# Patient Record
Sex: Female | Born: 1935 | Race: Black or African American | Hispanic: No | Smoking: Never smoker
Health system: Southern US, Community
[De-identification: ages and names within clinical notes are randomized; demographics above are authoritative.]

## PROBLEM LIST (undated history)

## (undated) DIAGNOSIS — C801 Malignant (primary) neoplasm, unspecified: Secondary | ICD-10-CM

## (undated) DIAGNOSIS — C50919 Malignant neoplasm of unspecified site of unspecified female breast: Secondary | ICD-10-CM

## (undated) DIAGNOSIS — Z9221 Personal history of antineoplastic chemotherapy: Secondary | ICD-10-CM

## (undated) HISTORY — PX: BACK SURGERY: SHX140

## (undated) HISTORY — PX: ABDOMINAL HYSTERECTOMY: SHX81

## (undated) HISTORY — PX: TONSILLECTOMY AND ADENOIDECTOMY: SHX28

## (undated) HISTORY — PX: LUMBAR LAMINECTOMY: SHX95

## (undated) HISTORY — DX: Malignant neoplasm of unspecified site of unspecified female breast: C50.919

## (undated) HISTORY — PX: CATARACT EXTRACTION, BILATERAL: SHX1313

---

## 2002-09-09 ENCOUNTER — Encounter: Admission: RE | Admit: 2002-09-09 | Discharge: 2002-09-09 | Payer: Self-pay | Admitting: Emergency Medicine

## 2002-09-09 ENCOUNTER — Encounter: Payer: Self-pay | Admitting: Emergency Medicine

## 2003-10-18 ENCOUNTER — Encounter: Admission: RE | Admit: 2003-10-18 | Discharge: 2003-10-18 | Payer: Self-pay | Admitting: Emergency Medicine

## 2003-10-22 ENCOUNTER — Encounter: Admission: RE | Admit: 2003-10-22 | Discharge: 2003-10-22 | Payer: Self-pay | Admitting: Emergency Medicine

## 2003-10-27 ENCOUNTER — Encounter: Admission: RE | Admit: 2003-10-27 | Discharge: 2003-10-27 | Payer: Self-pay | Admitting: Emergency Medicine

## 2004-05-26 ENCOUNTER — Ambulatory Visit (HOSPITAL_COMMUNITY): Admission: RE | Admit: 2004-05-26 | Discharge: 2004-05-26 | Payer: Self-pay | Admitting: Orthopedic Surgery

## 2004-05-29 ENCOUNTER — Ambulatory Visit (HOSPITAL_BASED_OUTPATIENT_CLINIC_OR_DEPARTMENT_OTHER): Admission: RE | Admit: 2004-05-29 | Discharge: 2004-05-29 | Payer: Self-pay | Admitting: Orthopedic Surgery

## 2004-09-21 ENCOUNTER — Encounter: Admission: RE | Admit: 2004-09-21 | Discharge: 2004-09-21 | Payer: Self-pay | Admitting: Orthopedic Surgery

## 2004-10-03 ENCOUNTER — Encounter: Admission: RE | Admit: 2004-10-03 | Discharge: 2004-10-03 | Payer: Self-pay | Admitting: Orthopedic Surgery

## 2004-11-02 ENCOUNTER — Encounter: Admission: RE | Admit: 2004-11-02 | Discharge: 2004-11-02 | Payer: Self-pay | Admitting: Orthopedic Surgery

## 2009-06-25 DIAGNOSIS — C50919 Malignant neoplasm of unspecified site of unspecified female breast: Secondary | ICD-10-CM

## 2009-06-25 HISTORY — DX: Malignant neoplasm of unspecified site of unspecified female breast: C50.919

## 2009-12-28 ENCOUNTER — Encounter: Admission: RE | Admit: 2009-12-28 | Discharge: 2009-12-28 | Payer: Self-pay | Admitting: Internal Medicine

## 2009-12-29 ENCOUNTER — Ambulatory Visit: Payer: Self-pay | Admitting: Oncology

## 2010-01-01 ENCOUNTER — Encounter: Admission: RE | Admit: 2010-01-01 | Discharge: 2010-01-01 | Payer: Self-pay | Admitting: Internal Medicine

## 2010-01-04 LAB — COMPREHENSIVE METABOLIC PANEL
Albumin: 4.6 g/dL (ref 3.5–5.2)
CO2: 27 mEq/L (ref 19–32)
Calcium: 10.2 mg/dL (ref 8.4–10.5)
Chloride: 103 mEq/L (ref 96–112)
Glucose, Bld: 92 mg/dL (ref 70–99)
Potassium: 4.1 mEq/L (ref 3.5–5.3)
Sodium: 141 mEq/L (ref 135–145)
Total Bilirubin: 0.4 mg/dL (ref 0.3–1.2)
Total Protein: 7.8 g/dL (ref 6.0–8.3)

## 2010-01-04 LAB — CBC WITH DIFFERENTIAL/PLATELET
Basophils Absolute: 0 10*3/uL (ref 0.0–0.1)
Eosinophils Absolute: 0.1 10*3/uL (ref 0.0–0.5)
HGB: 12.2 g/dL (ref 11.6–15.9)
MCV: 89.1 fL (ref 79.5–101.0)
MONO#: 0.6 10*3/uL (ref 0.1–0.9)
MONO%: 8.5 % (ref 0.0–14.0)
NEUT#: 4.4 10*3/uL (ref 1.5–6.5)
RDW: 14.8 % — ABNORMAL HIGH (ref 11.2–14.5)
WBC: 6.6 10*3/uL (ref 3.9–10.3)

## 2010-01-11 ENCOUNTER — Ambulatory Visit (HOSPITAL_COMMUNITY): Admission: RE | Admit: 2010-01-11 | Discharge: 2010-01-11 | Payer: Self-pay | Admitting: Oncology

## 2010-01-23 ENCOUNTER — Ambulatory Visit (HOSPITAL_COMMUNITY): Admission: RE | Admit: 2010-01-23 | Discharge: 2010-01-23 | Payer: Self-pay | Admitting: Oncology

## 2010-03-02 ENCOUNTER — Ambulatory Visit: Payer: Self-pay | Admitting: Oncology

## 2010-03-06 ENCOUNTER — Ambulatory Visit: Admission: RE | Admit: 2010-03-06 | Discharge: 2010-03-17 | Payer: Self-pay | Admitting: Radiation Oncology

## 2010-03-07 LAB — CBC WITH DIFFERENTIAL/PLATELET
Basophils Absolute: 0 10*3/uL (ref 0.0–0.1)
Eosinophils Absolute: 0.1 10*3/uL (ref 0.0–0.5)
HGB: 12 g/dL (ref 11.6–15.9)
LYMPH%: 20.5 % (ref 14.0–49.7)
MCV: 88.6 fL (ref 79.5–101.0)
MONO%: 8.7 % (ref 0.0–14.0)
NEUT#: 4.2 10*3/uL (ref 1.5–6.5)
Platelets: 268 10*3/uL (ref 145–400)
RDW: 13.9 % (ref 11.2–14.5)

## 2010-03-07 LAB — T3: T3, Total: 117 ng/dL (ref 80.0–204.0)

## 2010-03-22 ENCOUNTER — Ambulatory Visit (HOSPITAL_COMMUNITY): Admission: RE | Admit: 2010-03-22 | Discharge: 2010-03-22 | Payer: Self-pay | Admitting: Oncology

## 2010-05-02 ENCOUNTER — Encounter: Admission: RE | Admit: 2010-05-02 | Discharge: 2010-05-02 | Payer: Self-pay | Admitting: Oncology

## 2010-05-04 ENCOUNTER — Ambulatory Visit: Payer: Self-pay | Admitting: Oncology

## 2010-05-08 LAB — CBC WITH DIFFERENTIAL/PLATELET
BASO%: 0.3 % (ref 0.0–2.0)
Eosinophils Absolute: 0.1 10*3/uL (ref 0.0–0.5)
MCHC: 33.9 g/dL (ref 31.5–36.0)
MONO#: 0.6 10*3/uL (ref 0.1–0.9)
NEUT#: 4.5 10*3/uL (ref 1.5–6.5)
RBC: 4.11 10*6/uL (ref 3.70–5.45)
RDW: 14.5 % (ref 11.2–14.5)
WBC: 6.7 10*3/uL (ref 3.9–10.3)

## 2010-06-07 ENCOUNTER — Ambulatory Visit (HOSPITAL_COMMUNITY)
Admission: RE | Admit: 2010-06-07 | Discharge: 2010-06-07 | Payer: Self-pay | Source: Home / Self Care | Attending: Oncology | Admitting: Oncology

## 2010-07-18 ENCOUNTER — Ambulatory Visit: Payer: Self-pay | Admitting: Oncology

## 2010-07-20 ENCOUNTER — Other Ambulatory Visit: Payer: Self-pay | Admitting: Oncology

## 2010-07-20 DIAGNOSIS — C50919 Malignant neoplasm of unspecified site of unspecified female breast: Secondary | ICD-10-CM

## 2010-07-20 LAB — CBC WITH DIFFERENTIAL/PLATELET
Basophils Absolute: 0 10*3/uL (ref 0.0–0.1)
Eosinophils Absolute: 0.1 10*3/uL (ref 0.0–0.5)
HGB: 12.5 g/dL (ref 11.6–15.9)
NEUT#: 4.1 10*3/uL (ref 1.5–6.5)
RDW: 13.9 % (ref 11.2–14.5)
lymph#: 1.4 10*3/uL (ref 0.9–3.3)

## 2010-07-25 ENCOUNTER — Other Ambulatory Visit: Payer: Self-pay | Admitting: Oncology

## 2010-07-25 DIAGNOSIS — C50912 Malignant neoplasm of unspecified site of left female breast: Secondary | ICD-10-CM

## 2010-08-03 ENCOUNTER — Ambulatory Visit
Admission: RE | Admit: 2010-08-03 | Discharge: 2010-08-03 | Disposition: A | Discharge status: Home / Self Care | Payer: MEDICARE | Source: Ambulatory Visit | Attending: Oncology | Admitting: Oncology

## 2010-08-03 DIAGNOSIS — C50912 Malignant neoplasm of unspecified site of left female breast: Secondary | ICD-10-CM

## 2010-08-03 MED ORDER — GADOBENATE DIMEGLUMINE 529 MG/ML IV SOLN
18.0000 mL | Freq: Once | INTRAVENOUS | Status: AC | PRN
  Administered 2010-08-03: 18 mL via INTRAVENOUS

## 2010-11-10 NOTE — Op Note (Signed)
NAMELAYCI, STENGLEIN             ACCOUNT NO.:  [id number]   MEDICAL RECORD NO.:  [id number]          PATIENT TYPE:  AMB   LOCATION:  DSC                          FACILITY:  [facility]   PHYSICIAN:  Robert A. Thurston Hole, M.D. DATE OF BIRTH:  10/13/1933   DATE OF PROCEDURE:  05/29/2004  DATE OF DISCHARGE:                                 OPERATIVE REPORT   PREOPERATIVE DIAGNOSIS:  Right knee medial and lateral meniscal tears with  chondromalacia and synovitis.   POSTOPERATIVE DIAGNOSIS:  Right knee medial and lateral meniscal tears with  chondromalacia and synovitis.   OPERATION PERFORMED:  1.  Right knee examination under anesthesia followed by arthroscopic partial      medial and lateral meniscectomies.  2.  Right knee chondroplasties with partial synovectomy.   SURGEON:  Elana Alm. Thurston Hole, M.D.   ANESTHESIA:  General.   OPERATIVE TIME:  30 minutes.   COMPLICATIONS:  None.   INDICATIONS FOR PROCEDURE:  Ms. Eifler is a 77 year old woman who has had  eight to 10 months of increasing right knee pain with exam and MRI  documenting a medial meniscus tear with chondromalacia and synovitis who has  failed conservative care and is now to undergo arthroscopy.   DESCRIPTION OF PROCEDURE:  Ms. Berggren was brought to the operating room on  May 29, 2004 and placed on the operating table in supine position. After  an adequate level of general anesthesia was obtained, her right knee was  examined.  Range of motion 0 to 125 degrees, 1 to 2+ crepitation.  Knee  stable to ligamentous exam with normal patellar tracking.  Knee was  sterilely injected with 0.25% Marcaine with epinephrine.  Right leg was  prepped using sterile DuraPrep and draped using sterile technique.  Originally through an anterior and lateral portal the arthroscope with a  pump attached was placed and through an anteromedial portal, an arthroscopic  probe was placed.  On initial inspection of the medial compartment, she was  found to have 75% grade 3 chondromalacia which was debrided.  Medial  meniscal tear posterior and medial horn of which 60 to 70% was resected back  to a stable rim.  ACL and PCL was normal.  Lateral compartment showed 30%  grade 3 chondromalacia which was debrided.  Lateral meniscus showed a small  tear posterolateral corner 25% which was resected back to a stable rim.  Patellofemoral joint showed 30 to 40% grade 3 chondromalacia on the  patellofemoral groove and this was debrided.  Patella tracked normally.  Moderate synovitis in the medial  lateral gutters was debrided.  Otherwise  they were free of pathology.  After this was done, it was felt that all  pathology had been satisfactorily addressed.  The instruments were removed.  Portals were closed with 3-0 nylon suture and injected with 0.25% Marcaine  with epinephrine and 4 mg of morphine.  Sterile dressing was applied and the  patient awakened and taken to the recovery room in stable condition.   FOLLOW UP:  Ms. Stroschein will be followed as an outpatient on Vicodin and  Naprosyn.  See her back  in the office in a week for suture removal and  follow-up.       RAW/MEDQ  D:  05/29/2004  T:  05/29/2004  Job:  [id number]

## 2010-11-27 ENCOUNTER — Other Ambulatory Visit: Payer: Self-pay | Admitting: Oncology

## 2010-11-27 ENCOUNTER — Encounter (HOSPITAL_BASED_OUTPATIENT_CLINIC_OR_DEPARTMENT_OTHER): Payer: Medicare Other | Admitting: Oncology

## 2010-11-27 DIAGNOSIS — C50919 Malignant neoplasm of unspecified site of unspecified female breast: Secondary | ICD-10-CM

## 2010-11-27 DIAGNOSIS — Z170 Estrogen receptor positive status [ER+]: Secondary | ICD-10-CM

## 2010-11-27 LAB — CBC WITH DIFFERENTIAL/PLATELET
BASO%: 0.3 % (ref 0.0–2.0)
Eosinophils Absolute: 0.1 10*3/uL (ref 0.0–0.5)
HCT: 34.6 % — ABNORMAL LOW (ref 34.8–46.6)
LYMPH%: 19 % (ref 14.0–49.7)
MONO#: 0.6 10*3/uL (ref 0.1–0.9)
NEUT#: 4.3 10*3/uL (ref 1.5–6.5)
NEUT%: 70.2 % (ref 38.4–76.8)
Platelets: 262 10*3/uL (ref 145–400)
RBC: 3.83 10*6/uL (ref 3.70–5.45)
WBC: 6.1 10*3/uL (ref 3.9–10.3)
lymph#: 1.2 10*3/uL (ref 0.9–3.3)

## 2010-11-27 LAB — COMPREHENSIVE METABOLIC PANEL
ALT: 17 U/L (ref 0–35)
CO2: 29 mEq/L (ref 19–32)
Calcium: 10.1 mg/dL (ref 8.4–10.5)
Chloride: 100 mEq/L (ref 96–112)
Glucose, Bld: 65 mg/dL — ABNORMAL LOW (ref 70–99)
Sodium: 139 mEq/L (ref 135–145)
Total Bilirubin: 0.3 mg/dL (ref 0.3–1.2)
Total Protein: 7.3 g/dL (ref 6.0–8.3)

## 2010-11-27 LAB — VITAMIN D 25 HYDROXY (VIT D DEFICIENCY, FRACTURES): Vit D, 25-Hydroxy: 40 ng/mL (ref 30–89)

## 2011-03-01 ENCOUNTER — Other Ambulatory Visit: Payer: Self-pay | Admitting: Oncology

## 2011-03-01 DIAGNOSIS — C50919 Malignant neoplasm of unspecified site of unspecified female breast: Secondary | ICD-10-CM

## 2011-03-14 ENCOUNTER — Ambulatory Visit (HOSPITAL_COMMUNITY)
Admission: RE | Admit: 2011-03-14 | Discharge: 2011-03-14 | Disposition: A | Discharge status: Home / Self Care | Payer: Medicare Other | Source: Ambulatory Visit | Attending: Oncology | Admitting: Oncology

## 2011-03-14 DIAGNOSIS — C50919 Malignant neoplasm of unspecified site of unspecified female breast: Secondary | ICD-10-CM | POA: Insufficient documentation

## 2011-03-14 LAB — CREATININE, SERUM: Creatinine, Ser: 0.83 mg/dL (ref 0.50–1.10)

## 2011-03-14 MED ORDER — GADOBENATE DIMEGLUMINE 529 MG/ML IV SOLN
15.0000 mL | Freq: Once | INTRAVENOUS | Status: AC | PRN
  Administered 2011-03-14: 15 mL via INTRAVENOUS

## 2011-03-29 ENCOUNTER — Encounter (HOSPITAL_BASED_OUTPATIENT_CLINIC_OR_DEPARTMENT_OTHER): Payer: Medicare Other | Admitting: Oncology

## 2011-03-29 ENCOUNTER — Other Ambulatory Visit: Payer: Self-pay | Admitting: Oncology

## 2011-03-29 DIAGNOSIS — Z170 Estrogen receptor positive status [ER+]: Secondary | ICD-10-CM

## 2011-03-29 DIAGNOSIS — C50912 Malignant neoplasm of unspecified site of left female breast: Secondary | ICD-10-CM

## 2011-03-29 DIAGNOSIS — C50919 Malignant neoplasm of unspecified site of unspecified female breast: Secondary | ICD-10-CM

## 2011-03-29 LAB — CBC WITH DIFFERENTIAL/PLATELET
Eosinophils Absolute: 0.1 10*3/uL (ref 0.0–0.5)
HCT: 35.1 % (ref 34.8–46.6)
LYMPH%: 21.2 % (ref 14.0–49.7)
MCV: 88.7 fL (ref 79.5–101.0)
MONO#: 0.5 10*3/uL (ref 0.1–0.9)
MONO%: 9.2 % (ref 0.0–14.0)
NEUT#: 3.8 10*3/uL (ref 1.5–6.5)
NEUT%: 68.6 % (ref 38.4–76.8)
Platelets: 235 10*3/uL (ref 145–400)
RBC: 3.96 10*6/uL (ref 3.70–5.45)

## 2011-03-30 LAB — COMPREHENSIVE METABOLIC PANEL
ALT: 17 U/L (ref 0–35)
AST: 25 U/L (ref 0–37)
CO2: 26 mEq/L (ref 19–32)
Chloride: 101 mEq/L (ref 96–112)
Creatinine, Ser: 0.84 mg/dL (ref 0.50–1.10)
Sodium: 139 mEq/L (ref 135–145)
Total Bilirubin: 0.4 mg/dL (ref 0.3–1.2)
Total Protein: 7.3 g/dL (ref 6.0–8.3)

## 2011-04-25 ENCOUNTER — Encounter (HOSPITAL_BASED_OUTPATIENT_CLINIC_OR_DEPARTMENT_OTHER): Payer: Medicare Other | Admitting: Oncology

## 2011-04-25 DIAGNOSIS — Z170 Estrogen receptor positive status [ER+]: Secondary | ICD-10-CM

## 2011-04-25 DIAGNOSIS — C50919 Malignant neoplasm of unspecified site of unspecified female breast: Secondary | ICD-10-CM

## 2011-04-25 DIAGNOSIS — Z79810 Long term (current) use of selective estrogen receptor modulators (SERMs): Secondary | ICD-10-CM

## 2011-05-08 ENCOUNTER — Telehealth: Payer: Self-pay | Admitting: *Deleted

## 2011-05-08 NOTE — Telephone Encounter (Signed)
PT. IS AT HER PRIMARY CARE PHYSICIAN'S OFFICE. MAY SHE HAVE A FLU SHOT? INFORMED PT. IT IS OK TO GET A FLU SHOT.

## 2011-07-03 ENCOUNTER — Other Ambulatory Visit: Payer: Self-pay

## 2011-07-03 ENCOUNTER — Encounter: Payer: Self-pay | Admitting: Emergency Medicine

## 2011-07-03 ENCOUNTER — Other Ambulatory Visit: Payer: Self-pay | Admitting: Oncology

## 2011-07-03 ENCOUNTER — Other Ambulatory Visit (HOSPITAL_BASED_OUTPATIENT_CLINIC_OR_DEPARTMENT_OTHER): Payer: Medicare Other | Admitting: Lab

## 2011-07-03 ENCOUNTER — Emergency Department (HOSPITAL_COMMUNITY): Payer: Medicare Other

## 2011-07-03 ENCOUNTER — Emergency Department (HOSPITAL_COMMUNITY)
Admission: EM | Admit: 2011-07-03 | Discharge: 2011-07-03 | Disposition: A | Discharge status: Home / Self Care | Payer: Medicare Other | Attending: Emergency Medicine | Admitting: Emergency Medicine

## 2011-07-03 DIAGNOSIS — R29810 Facial weakness: Secondary | ICD-10-CM | POA: Insufficient documentation

## 2011-07-03 DIAGNOSIS — C50919 Malignant neoplasm of unspecified site of unspecified female breast: Secondary | ICD-10-CM

## 2011-07-03 DIAGNOSIS — G510 Bell's palsy: Secondary | ICD-10-CM

## 2011-07-03 DIAGNOSIS — Z79810 Long term (current) use of selective estrogen receptor modulators (SERMs): Secondary | ICD-10-CM | POA: Insufficient documentation

## 2011-07-03 DIAGNOSIS — Z853 Personal history of malignant neoplasm of breast: Secondary | ICD-10-CM | POA: Insufficient documentation

## 2011-07-03 HISTORY — DX: Malignant (primary) neoplasm, unspecified: C80.1

## 2011-07-03 LAB — CBC WITH DIFFERENTIAL/PLATELET
BASO%: 0.3 % (ref 0.0–2.0)
Basophils Absolute: 0 10*3/uL (ref 0.0–0.1)
EOS%: 1 % (ref 0.0–7.0)
HCT: 36 % (ref 34.8–46.6)
HGB: 12.1 g/dL (ref 11.6–15.9)
LYMPH%: 22.4 % (ref 14.0–49.7)
MCH: 30.3 pg (ref 25.1–34.0)
MCHC: 33.5 g/dL (ref 31.5–36.0)
MCV: 90.4 fL (ref 79.5–101.0)
MONO%: 9.6 % (ref 0.0–14.0)
NEUT%: 66.7 % (ref 38.4–76.8)
Platelets: 244 10*3/uL (ref 145–400)

## 2011-07-03 LAB — POCT I-STAT, CHEM 8
BUN: 19 mg/dL (ref 6–23)
Creatinine, Ser: 1.1 mg/dL (ref 0.50–1.10)
Glucose, Bld: 94 mg/dL (ref 70–99)
Hemoglobin: 12.6 g/dL (ref 12.0–15.0)
Potassium: 3.6 mEq/L (ref 3.5–5.1)
Sodium: 139 mEq/L (ref 135–145)

## 2011-07-03 LAB — BASIC METABOLIC PANEL
BUN: 19 mg/dL (ref 6–23)
CO2: 28 mEq/L (ref 19–32)
Calcium: 9.5 mg/dL (ref 8.4–10.5)
Chloride: 99 mEq/L (ref 96–112)
Creatinine, Ser: 0.89 mg/dL (ref 0.50–1.10)
GFR calc Af Amer: 72 mL/min — ABNORMAL LOW (ref >90)
GFR calc non Af Amer: 62 mL/min — ABNORMAL LOW (ref >90)
Glucose, Bld: 93 mg/dL (ref 70–99)
Potassium: 3.7 mEq/L (ref 3.5–5.1)
Sodium: 138 mEq/L (ref 135–145)

## 2011-07-03 LAB — CANCER ANTIGEN 27.29: CA 27.29: 14 U/mL (ref 0–39)

## 2011-07-03 LAB — CBC
HCT: 32.7 % — ABNORMAL LOW (ref 36.0–46.0)
MCH: 29.1 pg (ref 26.0–34.0)
MCV: 87.4 fL (ref 78.0–100.0)
Platelets: 234 10*3/uL (ref 150–400)
RDW: 13.7 % (ref 11.5–15.5)
WBC: 5.4 10*3/uL (ref 4.0–10.5)

## 2011-07-03 LAB — COMPREHENSIVE METABOLIC PANEL
Albumin: 4.4 g/dL (ref 3.5–5.2)
BUN: 20 mg/dL (ref 6–23)
Calcium: 9.4 mg/dL (ref 8.4–10.5)
Chloride: 99 mEq/L (ref 96–112)
Creatinine, Ser: 1.01 mg/dL (ref 0.50–1.10)
Glucose, Bld: 87 mg/dL (ref 70–99)
Potassium: 3.6 mEq/L (ref 3.5–5.3)

## 2011-07-03 MED ORDER — PREDNISONE 5 MG PO TABS: 5.0000 mg | ORAL_TABLET | Freq: Every day | ORAL | Status: AC

## 2011-07-03 MED ORDER — PREDNISONE 50 MG PO TABS: ORAL_TABLET | ORAL | Status: AC

## 2011-07-03 MED ORDER — VALACYCLOVIR HCL 1 G PO TABS: 1000.0000 mg | ORAL_TABLET | Freq: Two times a day (BID) | ORAL | Status: DC

## 2011-07-03 MED ORDER — VALACYCLOVIR HCL 500 MG PO TABS
1000.0000 mg | ORAL_TABLET | Freq: Once | ORAL | Status: AC
  Administered 2011-07-03: 1000 mg via ORAL
  Filled 2011-07-03 (×2): qty 2

## 2011-07-03 MED ORDER — PREDNISONE 50 MG PO TABS
50.0000 mg | ORAL_TABLET | Freq: Once | ORAL | Status: AC
  Administered 2011-07-03: 50 mg via ORAL
  Filled 2011-07-03: qty 1

## 2011-07-03 NOTE — ED Provider Notes (Signed)
History     CSN: [id number]  Arrival date & time 07/03/11  1043   First MD Initiated Contact with Patient 07/03/11 1128      Chief Complaint  Patient presents with  . Facial Swelling  . Facial Droop    (Consider location/radiation/quality/duration/timing/severity/associated sxs/prior treatment) HPI The patient presents 6 hours after awakening with left sided facial droop. She notes that she has been in her usual state of health, taking daily tamoxifen for breast cancer, and denies any recent changes in medications, activities, or anything notable. Today soon after awakening she noticed that the left eye her face was drooping and that she was having a difficult time drinking liquids. She denies any pain. She denies any alleviating or exacerbating factors. She presented to her oncologist's office and was referred to the emergency department for further evaluation. Past Medical History  Diagnosis Date  . Cancer     History reviewed. No pertinent past surgical history.  History reviewed. No pertinent family history.  History  Substance Use Topics  . Smoking status: Not on file  . Smokeless tobacco: Not on file  . Alcohol Use:     OB History    Grav Para Term Preterm Abortions TAB SAB Ect Mult Living                  Review of Systems  Constitutional:       HPI  HENT:       HPI otherwise negative  Eyes: Negative.   Respiratory:       HPI, otherwise negative  Cardiovascular:       HPI, otherwise nmegative  Gastrointestinal: Negative for vomiting.  Genitourinary:       HPI, otherwise negative  Musculoskeletal:       HPI, otherwise negative  Skin: Negative.   Neurological: Negative for syncope.    Allergies  Sulfa antibiotics  Home Medications   Current Outpatient Rx  Name Route Sig Dispense Refill  . BIOTIN FORTE PO Oral Take 1 tablet by mouth daily.      Marland Kitchen CALCIUM CARBONATE-VITAMIN D 600-400 MG-UNIT PO TABS Oral Take 1 tablet by mouth daily.      Marland Kitchen  FLUTICASONE PROPIONATE 50 MCG/ACT NA SUSP Nasal Place 2 sprays into the nose daily. allergies    . FUROSEMIDE 40 MG PO TABS Oral Take 40 mg by mouth daily.      Marland Kitchen LISINOPRIL-HYDROCHLOROTHIAZIDE 20-25 MG PO TABS Oral Take 1 tablet by mouth daily.      Marland Kitchen LOVASTATIN 40 MG PO TABS Oral Take 40 mg by mouth at bedtime.      . CENTRUM SILVER PO Oral Take 1 tablet by mouth daily.      Marland Kitchen POTASSIUM CHLORIDE CRYS ER 20 MEQ PO TBCR Oral Take 20 mEq by mouth daily.      Marland Kitchen TAMOXIFEN CITRATE 20 MG PO TABS Oral Take 20 mg by mouth daily.        Pulse 78  Temp(Src) 97.8 F (36.6 C) (Oral)  Resp 16  SpO2 98%  Physical Exam  Nursing note and vitals reviewed. Constitutional: She is oriented to person, place, and time. She appears well-developed and well-nourished. No distress.  HENT:  Head: Normocephalic and atraumatic.  Eyes: Conjunctivae and EOM are normal.  Cardiovascular: Normal rate and regular rhythm.   Pulmonary/Chest: Effort normal and breath sounds normal. No stridor. No respiratory distress.  Abdominal: She exhibits no distension.  Musculoskeletal: She exhibits no edema.  Neurological: She is alert and  oriented to person, place, and time. She has normal strength. She displays no atrophy and no tremor. A cranial nerve deficit is present. No sensory deficit. She exhibits normal muscle tone. She displays no seizure activity. Coordination and gait normal.       Left facial droop, most pronounced with smiling, no appreciable asymmetry with brow raising  Skin: Skin is warm and dry.  Psychiatric: She has a normal mood and affect.    ED Course  Procedures (including critical care time)   Labs Reviewed  I-STAT, CHEM 8  BASIC METABOLIC PANEL  CBC  PROTIME-INR   No results found.   No diagnosis found.    MDM  Early well-appearing 78 year old female presents following the onset of left facial droop. The patient's absence of other asymmetry, any other weakness, the characteristics of the  deficiency are all reassuring for Bell's palsy as most likely etiology. Given the patient's history of breast cancer, she had a CT brain which was unremarkable. The remainder of the patient's laboratory evaluation was similarly unremarkable.  Given this early presentation of the patient will be started on steroids and valacyclovir.  The possibility of this being a manifestation of a small stroke was discussed with the patient, as the utility of starting therapy today for this presumed etiology, having MRI tomorrow concurrently with her scheduled thoracic MR, then reassessing to ensure that this is not indeed a manifestation of a smaller infarct or metastases .     Gerhard Munch, MD 07/03/11 1430

## 2011-07-03 NOTE — ED Notes (Signed)
MD at bedside assessing pt.

## 2011-07-03 NOTE — ED Notes (Signed)
MD at bedside. 

## 2011-07-03 NOTE — ED Notes (Signed)
Pt states woke up at 0600 and R side of face swollen and R side bottom lip drooping and unable to control lip. Pt states was unable to hold liquid in mouth this am, pt talking without difficulties at this time.  Pt denies pain or weakness. Pt noted to have equal strong grips no further neuro deficits at this time. Pt has breast CA and is on treatment for. Pt has MRI tomorrow.

## 2011-07-03 NOTE — ED Notes (Signed)
Pt waiting for meds from Pharmacy, then will be d/c to home.

## 2011-07-03 NOTE — ED Notes (Signed)
Returned from CT scan, waiting for results

## 2011-07-03 NOTE — ED Notes (Signed)
Pt denies trauma, pt denies pain and weakness at this time. Pt denies any further s/s, was at [facility] getting lab work and was told to come to ER

## 2011-07-04 ENCOUNTER — Other Ambulatory Visit (HOSPITAL_COMMUNITY): Payer: Medicare Other

## 2011-07-04 ENCOUNTER — Inpatient Hospital Stay: Admission: RE | Admit: 2011-07-04 | Payer: Medicare Other | Source: Ambulatory Visit

## 2011-07-10 ENCOUNTER — Ambulatory Visit: Payer: Medicare Other | Admitting: Oncology

## 2011-07-13 ENCOUNTER — Encounter: Payer: Self-pay | Admitting: Oncology

## 2011-07-13 NOTE — Progress Notes (Signed)
07/13/2011  Received via fax an approval for [facility].  I have spoken with the patient to give her this information.  I have also emailed Sullivan Cox @ [facility] so this appt. Can be scheduled.  [phone]   01/16/20130-003/07/2011

## 2011-07-31 ENCOUNTER — Telehealth: Payer: Self-pay | Admitting: *Deleted

## 2011-07-31 NOTE — Telephone Encounter (Signed)
Notified pt of future appointments.  

## 2011-08-01 ENCOUNTER — Ambulatory Visit
Admission: RE | Admit: 2011-08-01 | Discharge: 2011-08-01 | Disposition: A | Discharge status: Home / Self Care | Payer: Medicare Other | Source: Ambulatory Visit | Attending: Oncology | Admitting: Oncology

## 2011-08-01 DIAGNOSIS — C50912 Malignant neoplasm of unspecified site of left female breast: Secondary | ICD-10-CM

## 2011-08-01 MED ORDER — GADOBENATE DIMEGLUMINE 529 MG/ML IV SOLN
17.0000 mL | Freq: Once | INTRAVENOUS | Status: AC | PRN
  Administered 2011-08-01: 17 mL via INTRAVENOUS

## 2011-08-06 ENCOUNTER — Ambulatory Visit (HOSPITAL_BASED_OUTPATIENT_CLINIC_OR_DEPARTMENT_OTHER): Payer: Medicare Other | Admitting: Oncology

## 2011-08-06 ENCOUNTER — Telehealth: Payer: Self-pay | Admitting: Oncology

## 2011-08-06 VITALS — BP 158/69 | HR 79 | Temp 98.1°F | Wt 179.1 lb

## 2011-08-06 DIAGNOSIS — C50919 Malignant neoplasm of unspecified site of unspecified female breast: Secondary | ICD-10-CM

## 2011-08-06 DIAGNOSIS — C50912 Malignant neoplasm of unspecified site of left female breast: Secondary | ICD-10-CM | POA: Insufficient documentation

## 2011-08-06 NOTE — Progress Notes (Signed)
ID: Rocky Link  DOB: August 08, 1933  MR#: [id number]  CSN#: [id number]   Interval History:   The patient returns today with her sister, [name], for followup of Lesleyanne's breast cancer. She really is tolerating the tamoxifen without unusual side effects. She has fewer hot flashes than with the letrozole. She is having some vaginal wetness, and she uses a pad to take care of that issue.  ROS:  When she came here in January for lab work she was found by the attack to have some right facial droopiness. She was walked across to the emergency room where she was evaluated and diagnosed with a right Bell's palsy. She was treated with Valtrex and prednisone for 10 days. The symptoms have just about completely resolved. Otherwise a detailed review of systems is stable. She is not exercising regularly at present are promised to get back to the treadmill at the gym within the week.  Allergies  Allergen Reactions  . Sulfa Antibiotics Rash    Current Outpatient Prescriptions  Medication Sig Dispense Refill  . Calcium Carbonate-Vitamin D (CALTRATE 600+D) 600-400 MG-UNIT per tablet Take 1 tablet by mouth daily.        . fluticasone (FLONASE) 50 MCG/ACT nasal spray Place 2 sprays into the nose daily. allergies      . furosemide (LASIX) 40 MG tablet Take 40 mg by mouth daily.        Marland Kitchen lisinopril-hydrochlorothiazide (PRINZIDE,ZESTORETIC) 20-25 MG per tablet Take 1 tablet by mouth daily.        Marland Kitchen lovastatin (MEVACOR) 40 MG tablet Take 40 mg by mouth at bedtime.        . Multiple Vitamins-Minerals (CENTRUM SILVER PO) Take 1 tablet by mouth daily.        . potassium chloride SA (K-DUR,KLOR-CON) 20 MEQ tablet Take 20 mEq by mouth daily.        . tamoxifen (NOLVADEX) 20 MG tablet Take 20 mg by mouth daily.        Marland Kitchen BIOTIN FORTE PO Take 1 tablet by mouth daily.        . valACYclovir (VALTREX) 1000 MG tablet Take 1 tablet (1,000 mg total) by mouth 2 (two) times daily.  20 tablet  0   PAST MEDICAL HISTORY:  Right knee  arthroscopy remotely under Dr. Thurston Hole, history of hypertension, history of seasonal allergies, status post TAH/BSO with a prior history of fibroids, status post tonsillectomy and adenoidectomy, status post lumbar laminectomy under Autumn Messing, status post bilateral cataract surgery, and history of hypercholesterolemia, history of right Bell's palsy as noted above  FAMILY HISTORY:  The patient's father died from a myocardial infarction at age 77.  The patient's mother died with heart problems at age 46.  The patient is one of 12 siblings, 3 sisters and 9 brothers.  There is no history of breast or ovarian cancer in the immediate family, although 2 of the patient's mother's sisters out of 4 had breast cancer late in life.  GYNECOLOGIC HISTORY:  The patient is GX, P0.  She took hormone replacement after her hysterectomy until about 2005.  SOCIAL HISTORY:  She used to live in [place], and worked for [employer].  When she retired in 2001, she moved back to [place].  She is a widow, and lives in [facility], which is a home for senior citizens.  She has an independent apartment, but if she needed nursing home services, it would be available through the same facility.  Her sister, Delois "[name]" Bergman,  is present today.  She also lives in [place].  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  In case of any problems, Kinlie would like [place] to contact Delois, and Delois's home number is [phone], and her cell number is [phone].  Objective:  Filed Vitals:   08/06/11 1505  BP: 158/69  Pulse: 79  Temp: 98.1 F (36.7 C)    BMI: There is no height on file to calculate BMI.   ECOG FS: 1  Physical Exam:   Sclerae unicteric  Oropharynx clear  There is a palpable lymph node in the left axilla measuring approximately 2 cm. This is unchanged from baseline. There is no other peripheral adenopathy.  Lungs clear -- no rales or rhonchi  Heart regular rate and  rhythm  Abdomen benign  MSK no focal spinal tenderness, no peripheral edema  Neuro nonfocal  Breast exam: Right breast is unremarkable. The left breast shows no palpable mass. There are no skin changes. The nipple is not retracted.  Lab Results:      Chemistry      Component Value Date/Time   NA 139 07/03/2011 1219   K 3.6 07/03/2011 1219   CL 99 07/03/2011 1219   CO2 28 07/03/2011 1210   BUN 19 07/03/2011 1219   CREATININE 1.10 07/03/2011 1219      Component Value Date/Time   CALCIUM 9.5 07/03/2011 1210   ALKPHOS 44 07/03/2011 1029   ALKPHOS 44 07/03/2011 1029   ALKPHOS 44 07/03/2011 1029   AST 29 07/03/2011 1029   AST 29 07/03/2011 1029   AST 29 07/03/2011 1029   ALT 23 07/03/2011 1029   ALT 23 07/03/2011 1029   ALT 23 07/03/2011 1029   BILITOT 0.5 07/03/2011 1029   BILITOT 0.5 07/03/2011 1029   BILITOT 0.5 07/03/2011 1029       Lab Results  Component Value Date   WBC 5.4 07/03/2011   HGB 12.6 07/03/2011   HCT 37.0 07/03/2011   MCV 87.4 07/03/2011   PLT 234 07/03/2011   NEUTROABS 4.1 07/03/2011    Studies/Results:  Mr Breast Bilateral W Wo Contrast  08/01/2011  *RADIOLOGY REPORT*  Clinical Data: Reevaluate response following neoadjuvant therapy.  BILATERAL BREAST MRI WITH AND WITHOUT CONTRAST  Technique: Multiplanar, multisequence MR images of both breasts were obtained prior to and following the intravenous administration of 17ml of Multihance.  Three dimensional images were evaluated at the independent DynaCad workstation.  Comparison:  03/14/2011 and 08/03/2010 .  Findings: There is mild generalized background enhancement.  In the area of previously seen mass located within the left breast at the 12 o'clock position there is no residual enhancement.  On the precontrast images, as noted previously, there is nonenhancing mass effect present which has decreased in size and now measures 2.0 x 1.5 x 1.5 cm in size.  There are no new enhancing lesions.  There are no suspicious axillary or internal mammary lymph nodes  and there are no additional findings.  IMPRESSION: No residual enhancement associated with the left breast mass located 12 o'clock position.  Nonenhancing mass effect is present but has decreased in size.  THREE-DIMENSIONAL MR IMAGE RENDERING ON INDEPENDENT WORKSTATION:  Three-dimensional MR images were rendered by post-processing of the original MR data on an independent workstation.  The three- dimensional MR images were interpreted, and findings were reported in the accompanying complete MRI report for this study.  BI-RADS CATEGORY 6:  Known biopsy-proven malignancy - appropriate action should be taken.  Original Report Authenticated By: Rolla Plate, M.D.    Assessment: 78 year old [place] woman status post left breast biopsy in July 2011 for clinical T2 N0, Stage IIA  invasive ductal carcinoma, grade 1, strongly estrogen and receptor positive, both at 100%, HER2/neu negative, with low MIB-1 of 5%. A left axillary lymph node biopsied at the time of initial diagnosis was negative.   (1) Declined surgery.    (2) On neoadjuvant letrozole between July 2011 and October 2012.  MRI at that time showed some evidence of tumor growth, letrozole was discontinued  (3)  started on tamoxifen in early October 2012.     Plan: I gave her the results of her MRI which do show improvement both in the density of the lesion and its size. We spent the better part of her 45 minute appointment today though I discussing the issue of surgery. I did my best to explain to her that we cannot cure her breast cancer with tamoxifen alone, and that the cancer will learn to grow through this pill this as it did through the last pill. That means we would have to change for potentially more aggressive treatment.   She remains very fearful of surgery, "because of my age". We went over the fact that the surgeon could do her lumpectomy under local anesthesia if necessary. She tells me she is going to think about it some more and  discuss it again when she returns to see me in May. We will do lab work that same day but she will have a mammogram at [facility] a few days before the visit. I gave her my card with my fax number so they can get [place] that result in time.     MAGRINAT,GUSTAV C 08/06/2011

## 2011-08-06 NOTE — Telephone Encounter (Signed)
gve the pt her may 2013 appt calendar 

## 2011-09-20 ENCOUNTER — Other Ambulatory Visit: Payer: Self-pay | Admitting: *Deleted

## 2011-09-20 DIAGNOSIS — C50919 Malignant neoplasm of unspecified site of unspecified female breast: Secondary | ICD-10-CM

## 2011-09-20 MED ORDER — TAMOXIFEN CITRATE 20 MG PO TABS: 20.0000 mg | ORAL_TABLET | Freq: Every day | ORAL | Status: DC

## 2011-10-02 ENCOUNTER — Other Ambulatory Visit: Payer: Medicare Other | Admitting: Lab

## 2011-10-02 ENCOUNTER — Ambulatory Visit: Payer: Medicare Other | Admitting: Oncology

## 2011-10-04 ENCOUNTER — Encounter: Payer: Self-pay | Admitting: Oncology

## 2011-10-04 ENCOUNTER — Telehealth: Payer: Self-pay | Admitting: *Deleted

## 2011-10-04 NOTE — Progress Notes (Signed)
10/04/2011  Received a phone call from this patient:  RE: Scheduling of her mammogram @ [facility] before her May 9th visit with Dr. Darnelle Catalan.  I informed mrs. Sterling that i would personally take the number to Crystal, his scheduler for this appt. To be scheduled and she would receive a phone call with the date and time.\\Linda Burnell Blanks

## 2011-10-04 NOTE — Telephone Encounter (Signed)
gave patient appointment for 10-2011 printed out calendar and gave to the patient 

## 2011-11-01 ENCOUNTER — Other Ambulatory Visit (HOSPITAL_BASED_OUTPATIENT_CLINIC_OR_DEPARTMENT_OTHER): Payer: Medicare Other | Admitting: Lab

## 2011-11-01 ENCOUNTER — Telehealth: Payer: Self-pay | Admitting: *Deleted

## 2011-11-01 ENCOUNTER — Ambulatory Visit (HOSPITAL_BASED_OUTPATIENT_CLINIC_OR_DEPARTMENT_OTHER): Payer: Medicare Other | Admitting: Oncology

## 2011-11-01 VITALS — BP 129/67 | HR 74 | Temp 98.1°F | Wt 177.9 lb

## 2011-11-01 DIAGNOSIS — C50919 Malignant neoplasm of unspecified site of unspecified female breast: Secondary | ICD-10-CM

## 2011-11-01 LAB — COMPREHENSIVE METABOLIC PANEL
ALT: 22 U/L (ref 0–35)
Albumin: 4.5 g/dL (ref 3.5–5.2)
CO2: 26 mEq/L (ref 19–32)
Chloride: 103 mEq/L (ref 96–112)
Glucose, Bld: 93 mg/dL (ref 70–99)
Potassium: 3.6 mEq/L (ref 3.5–5.3)
Sodium: 138 mEq/L (ref 135–145)
Total Bilirubin: 0.4 mg/dL (ref 0.3–1.2)
Total Protein: 7 g/dL (ref 6.0–8.3)

## 2011-11-01 LAB — CBC WITH DIFFERENTIAL/PLATELET
Basophils Absolute: 0 10*3/uL (ref 0.0–0.1)
Eosinophils Absolute: 0.1 10*3/uL (ref 0.0–0.5)
HGB: 11.9 g/dL (ref 11.6–15.9)
MONO#: 0.4 10*3/uL (ref 0.1–0.9)
MONO%: 7.4 % (ref 0.0–14.0)
NEUT#: 3.8 10*3/uL (ref 1.5–6.5)
RBC: 3.98 10*6/uL (ref 3.70–5.45)
RDW: 13.9 % (ref 11.2–14.5)
WBC: 5.9 10*3/uL (ref 3.9–10.3)
lymph#: 1.6 10*3/uL (ref 0.9–3.3)
nRBC: 0 % (ref 0–0)

## 2011-11-01 NOTE — Telephone Encounter (Signed)
made patient appointment for mammogram at [facility] on 04-21-2012 at 11:30am faxed over order on 11-01-2011 to [phone] printed out calendar and gave to the patient for 05-05-2012

## 2011-11-01 NOTE — Progress Notes (Signed)
ID: Rocky Link   DOB: Mar 29, 1934  MR#: [id number]  JXB#:[id number]  HISTORY OF PRESENT ILLNESS:  The patient had mammography in [facility], which showed some architectural distortion in dense breasts.  She had an ultrasound there as well, but I do not have that report.  She was referred to [facility] for further evaluation, and on 07/06 Dr. Deboraha Sprang performed left breast ultrasound, which showed an ill defined solid mass at 12 o'clock, 2 cm from the left nipple meaasuring 2.8 cm.  Ultrasound of the left axilla showed a lymph node with thickened cortex suspicious for metastatic deposit.  Both the breast mass and the lymph node were biopsied that day, and the pathology [id number]) showed the mass to be an invasive ductal carcinoma with abundant mucin, low grade, strongly estrogen and progesterone receptor positive, HER-2 negative with a low proliferation fraction at 5%.  The lymph node biopsy was negative.  With this information the patient was referred to Dr. Luisa Hart, and bilateral breast MRIs were obtained on 07/10.  This showed a 3.6 cm lobulated enhancing mass in the central left breast with no additional masses.  The mildly enlarged left axillary lymph node previously biopsied was again seen by MRI.  It measured 2.1 cm.  There were no other abnormal appearing lymph nodes.  There was a small cyst incidentally noted in the left lobe of the liver.  The patient has refused surgery. Her subsequent history is as detailed below.  INTERVAL HISTORY: Amiliana returns today with her sister for followup of Jaleisa's breast cancer. The interval history is generally unremarkable. She is not exercising however. Previously she was going to the gym regularly, using the treadmill. There is "no reason" particularly why she hasn't been going. She tells me she is planning to "get back into it soon".  REVIEW OF SYSTEMS: She generally feels well. She denies any unusual headaches, visual changes, cough,  phlegm production, pleurisy, shortness of breath, or any change in bowel or bladder habits. A detailed review of systems was entirely stable  PAST MEDICAL HISTORY: Past Medical History  Diagnosis Date  . Cancer   Right knee arthroscopy remotely under Dr. Thurston Hole, history of hypertension, history of seasonal allergies, status post TAH/BSO with a prior history of fibroids, status post tonsillectomy and adenoidectomy, status post lumbar laminectomy under Autumn Messing, status post bilateral cataract surgery, and history of hypercholesterolemia.  FAMILY HISTORY The patient's father died from a myocardial infarction at age 16.  The patient's mother died with heart problems at age 36.  The patient is one of 12 siblings, 3 sisters and 9 brothers.  There is no history of breast or ovarian cancer in the immediate family, although 2 of the patient's mother's sisters out of 4 had breast cancer late in life.  GYNECOLOGIC HISTORY: The patient is GX, P0.  She took hormone replacement after her hysterectomy until about 2005.  SOCIAL HISTORY: She used to live in [place], and worked for [employer].  When she retired in 2001, she moved back to [place].  She is a widow, and lives in [facility], which is a home for senior citizens.  She has an independent apartment, but if she needed nursing home services, it would be available through the same facility.  Her sister, [name], is present today.  She also lives in [place].  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  In case of any problems, Maddalyn would like [place] to contact Delois, and Delois's  home number is [phone], and her cell number is [phone].   ADVANCED DIRECTIVES: in place  HEALTH MAINTENANCE: History  Substance Use Topics  . Smoking status: Not on file  . Smokeless tobacco: Not on file  . Alcohol Use:      Colonoscopy: 2007/[facility]  PAP: s/p hysterectomy  Bone density: May 2010/ normal  Lipid panel:  "good"  Allergies  Allergen Reactions  . Sulfa Antibiotics Rash    Current Outpatient Prescriptions  Medication Sig Dispense Refill  . Calcium Carbonate-Vitamin D (CALTRATE 600+D) 600-400 MG-UNIT per tablet Take 1 tablet by mouth daily.        . fluticasone (FLONASE) 50 MCG/ACT nasal spray Place 2 sprays into the nose daily. allergies      . furosemide (LASIX) 40 MG tablet Take 40 mg by mouth daily.        Marland Kitchen lisinopril-hydrochlorothiazide (PRINZIDE,ZESTORETIC) 20-25 MG per tablet Take 1 tablet by mouth daily.        Marland Kitchen lovastatin (MEVACOR) 40 MG tablet Take 40 mg by mouth at bedtime.        . Multiple Vitamins-Minerals (CENTRUM SILVER PO) Take 1 tablet by mouth daily.        . potassium chloride SA (K-DUR,KLOR-CON) 20 MEQ tablet Take 20 mEq by mouth daily.        . tamoxifen (NOLVADEX) 20 MG tablet Take 1 tablet (20 mg total) by mouth daily.  90 tablet  0  . BIOTIN FORTE PO Take 1 tablet by mouth daily.          OBJECTIVE: Middle-aged [place] American woman, who appears well Filed Vitals:   11/01/11 1351  BP: 129/67  Pulse: 74  Temp: 98.1 F (36.7 C)     There is no height on file to calculate BMI.    ECOG FS: 1 Sclerae unicteric Oropharynx clear No peripheral adenopathy Lungs no rales or rhonchi Heart regular rate and rhythm Abd benign MSK no focal spinal tenderness, no peripheral edema Neuro: nonfocal Breasts: The right breast is unremarkable. I do not palpate a well-defined mass in the left breast. There are no skin or nipple changes of concern  LAB RESULTS: Lab Results  Component Value Date   WBC 5.9 11/01/2011   NEUTROABS 3.8 11/01/2011   HGB 11.9 11/01/2011   HCT 36.2 11/01/2011   MCV 90.8 11/01/2011   PLT 252 11/01/2011      Chemistry      Component Value Date/Time   NA 139 07/03/2011 1219   K 3.6 07/03/2011 1219   CL 99 07/03/2011 1219   CO2 28 07/03/2011 1210   BUN 19 07/03/2011 1219   CREATININE 1.10 07/03/2011 1219      Component Value Date/Time   CALCIUM 9.5 07/03/2011  1210   ALKPHOS 44 07/03/2011 1029   ALKPHOS 44 07/03/2011 1029   ALKPHOS 44 07/03/2011 1029   AST 29 07/03/2011 1029   AST 29 07/03/2011 1029   AST 29 07/03/2011 1029   ALT 23 07/03/2011 1029   ALT 23 07/03/2011 1029   ALT 23 07/03/2011 1029   BILITOT 0.5 07/03/2011 1029   BILITOT 0.5 07/03/2011 1029   BILITOT 0.5 07/03/2011 1029       Lab Results  Component Value Date   LABCA2 14 07/03/2011   LABCA2 14 07/03/2011    No components found with this basename: [id number]    No results found for this basename: INR:1;PROTIME:1 in the last 168 hours  Urinalysis No results found for this basename: colorurine,  appearanceur, labspec, phurine, glucoseu, hgbur, bilirubinur, ketonesur, proteinur, urobilinogen, nitrite, leukocytesur    STUDIES: The patient tells me she had repeat mammography at [facility] 10/09/2011, but I do not have that report  ASSESSMENT: 78 year old [place] woman status post left breast biopsy in July 2011 for clinical T2 N0, Stage IIA invasive ductal carcinoma, grade 1, strongly estrogen and receptor positive, both at 100%, HER2/neu negative, with low MIB-1 of 5%. A left axillary lymph node biopsied at the time of initial diagnosis was negative.  (1) Declined surgery.  (2) On neoadjuvant letrozole between July 2011 and October 2012. MRI at that time showed some evidence of tumor growth, letrozole was discontinued  (3) started on tamoxifen in early October 2012, MRI February 2013 showed a significant response   PLAN: Rosaleigh is doing terrific with the tamoxifen, specifically with evidence of response and no significant side effects. We will obtain the mammogram just done at [facility], and since she has tumor in place that needs follow up she will have a left mammogram before her return visit here, which will be in 6 months. The plan is to continue tamoxifen indefinitely, with every 6 month followup including mammography, until there is evidence of disease progression. She knows to  call for any problems that may develop before the next visit   Tanea Moga C    11/01/2011

## 2011-11-12 ENCOUNTER — Encounter: Payer: Self-pay | Admitting: Oncology

## 2011-11-12 NOTE — Progress Notes (Signed)
Spoke with patient today about renewing EPP application, I mailed an application to the patient today. She will send everything needed back with the application in July.

## 2011-11-23 ENCOUNTER — Encounter: Payer: Self-pay | Admitting: Oncology

## 2011-12-18 ENCOUNTER — Other Ambulatory Visit: Payer: Self-pay | Admitting: Oncology

## 2011-12-25 ENCOUNTER — Encounter: Payer: Self-pay | Admitting: Oncology

## 2012-01-08 ENCOUNTER — Encounter: Payer: Self-pay | Admitting: Oncology

## 2012-01-08 NOTE — Progress Notes (Signed)
Patient approved for EPP 100% discount for family of 1 income 15204.00 01/08/12 to 07/10/12

## 2012-01-08 NOTE — Progress Notes (Signed)
Patient called to see if I had received the fax that she sent I let the patient know that I had not received the fax. I gave her the number to my fax [phone] she had faxed her information to another number [phone] she will be refaxing her information to correct fax.

## 2012-02-08 ENCOUNTER — Telehealth: Payer: Self-pay | Admitting: Oncology

## 2012-02-08 NOTE — Telephone Encounter (Signed)
S/w the pt and she is aware of her r/s appt time on 05/05/2012 from 1:30pm to 1:45pm.

## 2012-03-24 ENCOUNTER — Other Ambulatory Visit: Payer: Self-pay | Admitting: Oncology

## 2012-03-24 DIAGNOSIS — C50919 Malignant neoplasm of unspecified site of unspecified female breast: Secondary | ICD-10-CM

## 2012-04-17 ENCOUNTER — Other Ambulatory Visit: Payer: Self-pay | Admitting: *Deleted

## 2012-05-05 ENCOUNTER — Telehealth: Payer: Self-pay | Admitting: *Deleted

## 2012-05-05 ENCOUNTER — Ambulatory Visit (HOSPITAL_BASED_OUTPATIENT_CLINIC_OR_DEPARTMENT_OTHER): Payer: Medicare Other | Admitting: Physician Assistant

## 2012-05-05 ENCOUNTER — Other Ambulatory Visit (HOSPITAL_BASED_OUTPATIENT_CLINIC_OR_DEPARTMENT_OTHER): Payer: Medicare Other | Admitting: Lab

## 2012-05-05 ENCOUNTER — Encounter: Payer: Self-pay | Admitting: Physician Assistant

## 2012-05-05 VITALS — BP 134/64 | HR 72 | Temp 97.0°F | Resp 20 | Wt 166.1 lb

## 2012-05-05 DIAGNOSIS — Z170 Estrogen receptor positive status [ER+]: Secondary | ICD-10-CM

## 2012-05-05 DIAGNOSIS — C50919 Malignant neoplasm of unspecified site of unspecified female breast: Secondary | ICD-10-CM

## 2012-05-05 LAB — COMPREHENSIVE METABOLIC PANEL (CC13)
Albumin: 4 g/dL (ref 3.5–5.0)
BUN: 11 mg/dL (ref 7.0–26.0)
Calcium: 9.6 mg/dL (ref 8.4–10.4)
Chloride: 105 mEq/L (ref 98–107)
Glucose: 85 mg/dl (ref 70–99)
Potassium: 3.8 mEq/L (ref 3.5–5.1)

## 2012-05-05 LAB — CBC WITH DIFFERENTIAL/PLATELET
Basophils Absolute: 0 10*3/uL (ref 0.0–0.1)
Eosinophils Absolute: 0.1 10*3/uL (ref 0.0–0.5)
HCT: 35.2 % (ref 34.8–46.6)
HGB: 11.7 g/dL (ref 11.6–15.9)
MCV: 90.3 fL (ref 79.5–101.0)
MONO%: 8.2 % (ref 0.0–14.0)
NEUT#: 3.3 10*3/uL (ref 1.5–6.5)
NEUT%: 58.9 % (ref 38.4–76.8)
RDW: 13.5 % (ref 11.2–14.5)

## 2012-05-05 MED ORDER — TAMOXIFEN CITRATE 20 MG PO TABS: 20.0000 mg | ORAL_TABLET | Freq: Every day | ORAL | Status: DC

## 2012-05-05 NOTE — Patient Instructions (Signed)
Continue on tamoxifen, 20 mg daily.  This has been refilled at [facility] in [facility].  Bilateral mammogram in April 2014.  Return in 6 months (May 2014) for labs and visit.  Call with any problems or questions   [phone]

## 2012-05-05 NOTE — Progress Notes (Signed)
ID: Melinda Hudson   DOB: 09-26-1933  MR#: [id number]  WUJ#:[id number]  HISTORY OF PRESENT ILLNESS:  The patient had mammography in [facility], which showed some architectural distortion in dense breasts.  She had an ultrasound there as well, but I do not have that report.  She was referred to [facility] for [facility] evaluation, and on 07/06 Dr. Deboraha Sprang performed left breast ultrasound, which showed an ill defined solid mass at 12 o'clock, 2 cm from the left nipple meaasuring 2.8 cm.  Ultrasound of the left axilla showed a lymph node with thickened cortex suspicious for metastatic deposit.  Both the breast mass and the lymph node were biopsied that day, and the pathology [id number]) showed the mass to be an invasive ductal carcinoma with abundant mucin, low grade, strongly estrogen and progesterone receptor positive, HER-2 negative with a low proliferation fraction at 5%.  The lymph node biopsy was negative.  With this information the patient was referred to Dr. Luisa Hart, and bilateral breast MRIs were obtained on 07/10.  This showed a 3.6 cm lobulated enhancing mass in the central left breast with no additional masses.  The mildly enlarged left axillary lymph node previously biopsied was again seen by MRI.  It measured 2.1 cm.  There were no other abnormal appearing lymph nodes.  There was a small cyst incidentally noted in the left lobe of the liver.  The patient has refused surgery. Her subsequent history is as detailed below.  INTERVAL HISTORY: Melinda Hudson returns today with her sister [name] for followup of Melinda Hudson's left breast cancer. She'll he continues on tamoxifen at 20 mg daily which she is tolerating well. Her recent every 6 month mammogram of the left breast showed stability, with no mammographic evidence of mass in the left breast.  Interval history is remarkable for a brief hospitalization in July. Melinda Hudson had abdominal pain with associated nausea and emesis. An endoscopy showed  some evidence, per her report, other bleeding. She was started on sulfate and omeprazole which she continues. These issues have now resolved, and she is followed by Dr.  [name] in [facility].  REVIEW OF SYSTEMS: She generally feels well. Her energy level is good. She's had no recent fevers or chills. She has occasional hot flashes which are not particularly problematic. She has clear vaginal discharge, but no vaginal bleeding and no vaginal dryness. No peripheral swelling or evidence of abnormal clotting. No signs of abnormal bleeding or bruising. No abnormal headaches or changes in vision. She's eating and drinking well and currently denies any nausea, abdominal pain, or evidence of change in bowel habits. She's had no new cough, shortness of breath, or chest pain. She denies any unusual myalgias or arthralgias.  A detailed review of systems is otherwise noncontributory.  PAST MEDICAL HISTORY: Past Medical History  Diagnosis Date  . Cancer   Right knee arthroscopy remotely under Dr. Thurston Hole, history of hypertension, history of seasonal allergies, status post TAH/BSO with a prior history of fibroids, status post tonsillectomy and adenoidectomy, status post lumbar laminectomy under Autumn Messing, status post bilateral cataract surgery, and history of hypercholesterolemia.  FAMILY HISTORY The patient's father died from a myocardial infarction at age 37.  The patient's mother died with heart problems at age 47.  The patient is one of 12 siblings, 3 sisters and 9 brothers.  There is no history of breast or ovarian cancer in the immediate family, although 2 of the patient's mother's sisters out of 4 had breast cancer late in life.  GYNECOLOGIC HISTORY: The patient is GX, P0.  She took hormone replacement after her hysterectomy until about 2005.  SOCIAL HISTORY: She used to live in [place], and worked for [employer].  When she retired in 2001, she moved back to [place].  She is a  widow, and lives in [facility], which is a home for senior citizens.  She has an independent apartment, but if she needed nursing home services, it would be available through the same facility.  Her sister, [name], is present today.  She also lives in [place].  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  In case of any problems, Melinda Hudson would like [place] to contact Delois, and Delois's home number is [phone], and her cell number is [phone].   ADVANCED DIRECTIVES: in place  HEALTH MAINTENANCE: History  Substance Use Topics  . Smoking status: Never Smoker   . Smokeless tobacco: Not on file  . Alcohol Use: No     Colonoscopy: 2007/[facility]  PAP: s/p hysterectomy  Bone density: May 2010/ normal  Lipid panel: "good"  Allergies  Allergen Reactions  . Sulfa Antibiotics Rash    Current Outpatient Prescriptions  Medication Sig Dispense Refill  . Calcium Carbonate-Vitamin D (CALTRATE 600+D) 600-400 MG-UNIT per tablet Take 1 tablet by mouth daily.        . fluticasone (FLONASE) 50 MCG/ACT nasal spray Place 2 sprays into the nose daily. allergies      . furosemide (LASIX) 40 MG tablet Take 40 mg by mouth daily.        Marland Kitchen lisinopril-hydrochlorothiazide (PRINZIDE,ZESTORETIC) 20-25 MG per tablet Take 1 tablet by mouth daily.        Marland Kitchen lovastatin (MEVACOR) 40 MG tablet Take 40 mg by mouth at bedtime.        . Multiple Vitamins-Minerals (CENTRUM SILVER PO) Take 1 tablet by mouth daily.        Marland Kitchen omeprazole (PRILOSEC) 20 MG capsule       . potassium chloride SA (K-DUR,KLOR-CON) 20 MEQ tablet Take 20 mEq by mouth daily.        . sucralfate (CARAFATE) 1 G tablet       . tamoxifen (NOLVADEX) 20 MG tablet Take 1 tablet (20 mg total) by mouth daily.  90 tablet  3    OBJECTIVE: Middle-aged [place] American woman, who appears well Filed Vitals:   05/05/12 1409  BP: 134/64  Pulse: 72  Temp: 97 F (36.1 C)  Resp: 20     There is no height on file to calculate BMI.     ECOG FS: 1 Filed Weights   05/05/12 1409  Weight: 166 lb 1.6 oz (75.342 kg)   Sclerae unicteric Oropharynx clear No peripheral adenopathy Lungs no rales or rhonchi Heart regular rate and rhythm Abd soft, nontender, positive bowel sounds MSK no focal spinal tenderness No peripheral edema Neuro: nonfocal, alert and oriented x3 Breasts: The right breast is unremarkable. Una and ble to palpate a distinct mass in the left breast. There are no skin or nipple changes of concern. Axillae are benign bilaterally with no palpable adenopathy.  LAB RESULTS: Lab Results  Component Value Date   WBC 5.6 05/05/2012   NEUTROABS 3.3 05/05/2012   HGB 11.7 05/05/2012   HCT 35.2 05/05/2012   MCV 90.3 05/05/2012   PLT 238 05/05/2012      Chemistry      Component Value Date/Time   NA 139 05/05/2012 1336   NA 138 11/01/2011 1321  K 3.8 05/05/2012 1336   K 3.6 11/01/2011 1321   CL 105 05/05/2012 1336   CL 103 11/01/2011 1321   CO2 28 05/05/2012 1336   CO2 26 11/01/2011 1321   BUN 11.0 05/05/2012 1336   BUN 13 11/01/2011 1321   CREATININE 0.9 05/05/2012 1336   CREATININE 0.96 11/01/2011 1321      Component Value Date/Time   CALCIUM 9.6 05/05/2012 1336   CALCIUM 9.3 11/01/2011 1321   ALKPHOS 44 05/05/2012 1336   ALKPHOS 40 11/01/2011 1321   AST 31 05/05/2012 1336   AST 25 11/01/2011 1321   ALT 30 05/05/2012 1336   ALT 22 11/01/2011 1321   BILITOT 0.46 05/05/2012 1336   BILITOT 0.4 11/01/2011 1321       Lab Results  Component Value Date   LABCA2 14 07/03/2011   LABCA2 14 07/03/2011    STUDIES: Most recent left mammogram at [facility] on 04/29/2012 showed no mammographic evidence to mass in left breast.   ASSESSMENT: 78 year old [place] woman status post left breast biopsy in July 2011 for clinical T2 N0, Stage IIA invasive ductal carcinoma, grade 1, strongly estrogen and receptor positive, both at 100%, HER2/neu negative, with low MIB-1 of 5%. A left axillary lymph node biopsied at the time of  initial diagnosis was negative.  (1) Declined surgery.  (2) On neoadjuvant letrozole between July 2011 and October 2012. MRI at that time showed some evidence of tumor growth, letrozole was discontinued  (3) started on tamoxifen in early October 2012, MRI February 2013 showed a significant response   PLAN:  Joselynn continues to do quite well with regards to her left breast cancer. She will continue on tamoxifen as before, and we will see her again in 6 months for followup. Prior to that visit, we will repeat a bilateral mammogram at [facility].  The plan is to continue tamoxifen indefinitely, with every 6 month followup including mammography, until there is evidence of disease progression. She knows to call for any problems that may develop before the next visit   Kateria Cutrona    05/05/2012

## 2012-05-05 NOTE — Telephone Encounter (Signed)
Gave patient appointment for 10-21-2012 lab only  Gave patient appointment for 10-28-2012 md only  Called [facility] left voice message for radiology to call me back

## 2012-05-19 ENCOUNTER — Encounter: Payer: Self-pay | Admitting: Oncology

## 2012-06-12 ENCOUNTER — Encounter: Payer: Self-pay | Admitting: Oncology

## 2012-06-12 NOTE — Progress Notes (Signed)
Patient left message and I called her back.She inquired about her new application for assistance. I advised her it expires 07/10/12, but I can send her a new one out today. I verified her address and advised her would not be processed until after 07/10/12.

## 2012-06-17 ENCOUNTER — Telehealth: Payer: Self-pay | Admitting: Oncology

## 2012-06-17 NOTE — Telephone Encounter (Signed)
Received call back from [facility] re pt's mammo appt. Pt scheduled for mammo @ [place] on 10/28/12 @ 1pm so appt w/GM for 10/28/12 was moved to 11/04/12. Per Windell Moulding she did s/w pt. lmonvm for pt today confirming mammo appt for 5/6 and due to to [facility] 5/6 appt w/GM moved to 5/13. Also confirmed lb appt for 4/29 and mailed schedule/referral today.

## 2012-07-18 ENCOUNTER — Other Ambulatory Visit: Payer: Self-pay | Admitting: *Deleted

## 2012-09-17 ENCOUNTER — Other Ambulatory Visit: Payer: Self-pay | Admitting: Oncology

## 2012-10-21 ENCOUNTER — Other Ambulatory Visit: Payer: Medicare Other | Admitting: Lab

## 2012-10-21 ENCOUNTER — Other Ambulatory Visit (HOSPITAL_BASED_OUTPATIENT_CLINIC_OR_DEPARTMENT_OTHER): Payer: Medicare Other | Admitting: Lab

## 2012-10-21 DIAGNOSIS — C50919 Malignant neoplasm of unspecified site of unspecified female breast: Secondary | ICD-10-CM

## 2012-10-21 LAB — CBC WITH DIFFERENTIAL/PLATELET
BASO%: 0.3 % (ref 0.0–2.0)
EOS%: 0.8 % (ref 0.0–7.0)
LYMPH%: 21 % (ref 14.0–49.7)
MCH: 30.2 pg (ref 25.1–34.0)
MCHC: 33.4 g/dL (ref 31.5–36.0)
MONO#: 0.6 10*3/uL (ref 0.1–0.9)
MONO%: 8.1 % (ref 0.0–14.0)
Platelets: 205 10*3/uL (ref 145–400)
RBC: 3.79 10*6/uL (ref 3.70–5.45)
WBC: 6.9 10*3/uL (ref 3.9–10.3)

## 2012-10-21 LAB — COMPREHENSIVE METABOLIC PANEL (CC13)
ALT: 20 U/L (ref 0–55)
AST: 26 U/L (ref 5–34)
Alkaline Phosphatase: 48 U/L (ref 40–150)
CO2: 28 mEq/L (ref 22–29)
Creatinine: 1 mg/dL (ref 0.6–1.1)
Sodium: 141 mEq/L (ref 136–145)
Total Bilirubin: 0.37 mg/dL (ref 0.20–1.20)
Total Protein: 7.1 g/dL (ref 6.4–8.3)

## 2012-10-28 ENCOUNTER — Ambulatory Visit: Payer: Medicare Other | Admitting: Oncology

## 2012-11-04 ENCOUNTER — Other Ambulatory Visit: Payer: Self-pay | Admitting: Oncology

## 2012-11-04 ENCOUNTER — Telehealth: Payer: Self-pay | Admitting: *Deleted

## 2012-11-04 ENCOUNTER — Ambulatory Visit (HOSPITAL_BASED_OUTPATIENT_CLINIC_OR_DEPARTMENT_OTHER): Payer: Medicare Other | Admitting: Oncology

## 2012-11-04 VITALS — BP 145/66 | HR 78 | Temp 98.3°F | Resp 20 | Wt 172.5 lb

## 2012-11-04 DIAGNOSIS — C50912 Malignant neoplasm of unspecified site of left female breast: Secondary | ICD-10-CM

## 2012-11-04 DIAGNOSIS — C50919 Malignant neoplasm of unspecified site of unspecified female breast: Secondary | ICD-10-CM

## 2012-11-04 DIAGNOSIS — Z170 Estrogen receptor positive status [ER+]: Secondary | ICD-10-CM

## 2012-11-04 MED ORDER — TAMOXIFEN CITRATE 20 MG PO TABS: 20.0000 mg | ORAL_TABLET | Freq: Every day | ORAL | Status: DC

## 2012-11-04 NOTE — Progress Notes (Signed)
ID: Melinda Hudson   DOB: 06/14/1934  MR#: [id number]  ZHY#:[id number]  HISTORY OF PRESENT ILLNESS:  The patient had mammography in [facility], which showed some architectural distortion in dense breasts.  She had an ultrasound there as well, but I do not have that report.  She was referred to [facility] for further evaluation, and on 07/06 Dr. Deboraha Sprang performed left breast ultrasound, which showed an ill defined solid mass at 12 o'clock, 2 cm from the left nipple meaasuring 2.8 cm.  Ultrasound of the left axilla showed a lymph node with thickened cortex suspicious for metastatic deposit.  Both the breast mass and the lymph node were biopsied that day, and the pathology [id number] [id number]) showed the mass to be an invasive ductal carcinoma with abundant mucin, low grade, strongly estrogen and progesterone receptor positive, HER-2 negative with a low proliferation fraction at 5%.  The lymph node biopsy was negative.  With this information the patient was referred to Dr. Luisa Hart, and bilateral breast MRIs were obtained on 07/10.  This showed a 3.6 cm lobulated enhancing mass in the central left breast with no additional masses.  The mildly enlarged left axillary lymph node previously biopsied was again seen by MRI.  It measured 2.1 cm.  There were no other abnormal appearing lymph nodes.  There was a small cyst incidentally noted in the left lobe of the liver.  The patient has refused surgery. Her subsequent history is as detailed below.  INTERVAL HISTORY: Melinda Hudson returns today for followup of Melinda Hudson's left breast cancer. Her sister, Melinda Hudson, could not come with her today. Melinda Hudson continues on tamoxifen, which is causing her mild hot flashes, and some vaginal wetness. She uses a pad to take care of that. Otherwise she is tolerating the medication well and she pays for dollars for 3 month supply.   REVIEW OF SYSTEMS: She has significant abdominal pain and lost quite a bit of weight, leading to  admission and evaluation which apparently showed significant peptic ulcer disease. She is now on Carafate and Prilosec, feels much better, and is gaining her weight back. She's gone back to exercising regularly. She does have some arthritis, but otherwise a detailed review of systems today was stable  PAST MEDICAL HISTORY: Past Medical History  Diagnosis Date  . Cancer   Right knee arthroscopy remotely under Dr. Thurston Hole, history of hypertension, history of seasonal allergies, status post TAH/BSO with a prior history of fibroids, status post tonsillectomy and adenoidectomy, status post lumbar laminectomy under Autumn Messing, status post bilateral cataract surgery, and history of hypercholesterolemia.  FAMILY HISTORY The patient's father died from a myocardial infarction at age 10.  The patient's mother died with heart problems at age 77.  The patient is one of 12 siblings, 3 sisters and 9 brothers.  There is no history of breast or ovarian cancer in the immediate family, although 2 of the patient's mother's sisters out of 4 had breast cancer late in life.  GYNECOLOGIC HISTORY: The patient is GX, P0.  She took hormone replacement after her hysterectomy until about 2005.  SOCIAL HISTORY: She used to live in [place], and worked for [employer].  When she retired in 2001, she moved back to [place].  She is a widow, and lives in [facility], which is a home for senior citizens.  She has an independent apartment, but if she needed nursing home services, it would be available through the same facility.  Her sister, Melinda Hudson, is present today.  She also lives in [place].  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  In case of any problems, Melinda Hudson would like [place] to contact [place], and [place]'s home number is [phone], and her cell number is [phone].   ADVANCED DIRECTIVES: in place  HEALTH MAINTENANCE: History  Substance Use Topics  . Smoking status: Never  Smoker   . Smokeless tobacco: Not on file  . Alcohol Use: No     Colonoscopy: 2007/[facility]  PAP: s/p hysterectomy  Bone density: May 2010/ normal  Lipid panel: "good"  Allergies  Allergen Reactions  . Sulfa Antibiotics Rash    Current Outpatient Prescriptions  Medication Sig Dispense Refill  . Calcium Carbonate-Vitamin D (CALTRATE 600+D) 600-400 MG-UNIT per tablet Take 1 tablet by mouth daily.        . fluticasone (FLONASE) 50 MCG/ACT nasal spray Place 2 sprays into the nose daily. allergies      . furosemide (LASIX) 40 MG tablet Take 40 mg by mouth daily.        Marland Kitchen lisinopril-hydrochlorothiazide (PRINZIDE,ZESTORETIC) 20-25 MG per tablet Take 1 tablet by mouth daily.        Marland Kitchen lovastatin (MEVACOR) 40 MG tablet Take 40 mg by mouth at bedtime.        . Multiple Vitamins-Minerals (CENTRUM SILVER PO) Take 1 tablet by mouth daily.        Marland Kitchen omeprazole (PRILOSEC) 20 MG capsule       . potassium chloride SA (K-DUR,KLOR-CON) 20 MEQ tablet Take 20 mEq by mouth daily.        . sucralfate (CARAFATE) 1 G tablet       . tamoxifen (NOLVADEX) 20 MG tablet Take 1 tablet (20 mg total) by mouth daily.  90 tablet  3  . tamoxifen (NOLVADEX) 20 MG tablet TAKE 1 TABLET BY MOUTH DAILY  90 tablet  1   No current facility-administered medications for this visit.    OBJECTIVE: Middle-aged [place] American woman, who appears well Filed Vitals:   11/04/12 1153  BP: 145/66  Pulse: 78  Temp: 98.3 F (36.8 C)  Resp: 20     There is no height on file to calculate BMI.    ECOG FS: 1 Filed Weights   11/04/12 1153  Weight: 172 lb 8 oz (78.245 kg)   Sclerae unicteric Oropharynx clear No cervical or supraclavicular adenopathy Lungs no rales or rhonchi Heart regular rate and rhythm Abd soft, nontender, positive bowel sounds MSK no focal spinal tenderness No peripheral edema Neuro: nonfocal, well oriented, pleasant affect Breasts: The right breast is unremarkable. I do not palpate any mass in the left  breast, and the skin and nipple areolar area are unremarkable. The left axilla is benign.  LAB RESULTS: Lab Results  Component Value Date   WBC 6.9 10/21/2012   NEUTROABS 4.8 10/21/2012   HGB 11.4* 10/21/2012   HCT 34.2* 10/21/2012   MCV 90.3 10/21/2012   PLT 205 10/21/2012      Chemistry      Component Value Date/Time   NA 141 10/21/2012 1113   NA 138 11/01/2011 1321   K 4.1 10/21/2012 1113   K 3.6 11/01/2011 1321   CL 104 10/21/2012 1113   CL 103 11/01/2011 1321   CO2 28 10/21/2012 1113   CO2 26 11/01/2011 1321   BUN 19.5 10/21/2012 1113   BUN 13 11/01/2011 1321   CREATININE 1.0 10/21/2012 1113   CREATININE 0.96 11/01/2011 1321      Component Value Date/Time  CALCIUM 9.1 10/21/2012 1113   CALCIUM 9.3 11/01/2011 1321   ALKPHOS 48 10/21/2012 1113   ALKPHOS 40 11/01/2011 1321   AST 26 10/21/2012 1113   AST 25 11/01/2011 1321   ALT 20 10/21/2012 1113   ALT 22 11/01/2011 1321   BILITOT 0.37 10/21/2012 1113   BILITOT 0.4 11/01/2011 1321       Lab Results  Component Value Date   LABCA2 14 07/03/2011    STUDIES: Result Narrative  *RADIOLOGY REPORT*  Bilateral digital diagnostic mammograms  History:  Known breast carcinoma on the left; the patient undergoing tamoxifen therapy  Comparison:  December 05, 2010; October 09, 2011; left breast April 29, 2012  Findings:  There is heterogeneously dense fibroglandular change bilaterally.  No well-defined mass on the left is not seen at this time.  An area of mild asymmetry on the left medially near the chest wall remains stable.  Currently there is no dominant mass. There are benign-appearing calcifications bilaterally but no clustered microcalcifications are seen on either side.  There is no architectural distortion.  There is no abnormal skin thickening.  Conclusion:  BIRADS 6.: known biopsied - proven malignancy. Appropriate action should be taken. There is no new lesion.  The previously noted mass on the left is not appreciable as a discrete mass and  appears essentially stable compared to more recent prior studies.  No new area of neoplasm appreciated.  No change on the right without mammographic evidence of malignancy on the right.  Annual mammographic surveillance remains advisable given the patient's age and history of carcinoma on the left.  Any change in physical examination would warrant prompt imaging correlation. These findings and recommendations were discussed with the patient at the time of this visit.   Original Report Authenticated By: Bretta Bang, M.D.  ASSESSMENT: 79 y.o.  [facility] woman status post left breast biopsy in July 2011 for clinical T2 N0, Stage IIA invasive ductal carcinoma, grade 1, strongly estrogen and receptor positive, both at 100%, HER2/neu negative, with low MIB-1 of 5%. A left axillary lymph node biopsied at the time of initial diagnosis was negative.   (1) Declined surgery.   (2) On neoadjuvant letrozole between July 2011 and October 2012. MRI at that time showed some evidence of tumor growth, letrozole was discontinued   (3) started on tamoxifen in early October 2012, MRI February 2013 showing a significant response   PLAN:  Mammography continues to show response to tamoxifen, so we are continuing on that medication. She will see [place] again in November, after her next mammogram. Since the cancer was never removed, the plan is to continue every 6 month followup indefinitely. If we do detect evidence of disease progression, she will need surgery at that time.  She really knows to call for any problems that may develop before next visit  MAGRINAT,GUSTAV C    11/04/2012

## 2012-11-04 NOTE — Telephone Encounter (Signed)
appts made and printed. Pt is aware that [facility] will call her w/ her mammogram appt. I faxed the orders for the mammo to the hospital per their request...td

## 2012-11-04 NOTE — Patient Instructions (Signed)
And lives in

## 2012-11-21 ENCOUNTER — Encounter: Payer: Self-pay | Admitting: Oncology

## 2013-03-15 ENCOUNTER — Other Ambulatory Visit: Payer: Self-pay | Admitting: Oncology

## 2013-03-15 DIAGNOSIS — C50912 Malignant neoplasm of unspecified site of left female breast: Secondary | ICD-10-CM

## 2013-03-17 ENCOUNTER — Other Ambulatory Visit: Payer: Self-pay | Admitting: Oncology

## 2013-03-17 DIAGNOSIS — C50919 Malignant neoplasm of unspecified site of unspecified female breast: Secondary | ICD-10-CM

## 2013-05-18 ENCOUNTER — Other Ambulatory Visit: Payer: Self-pay | Admitting: *Deleted

## 2013-05-18 ENCOUNTER — Other Ambulatory Visit (HOSPITAL_BASED_OUTPATIENT_CLINIC_OR_DEPARTMENT_OTHER): Payer: Medicare Other | Admitting: Lab

## 2013-05-18 ENCOUNTER — Ambulatory Visit: Payer: Medicare Other

## 2013-05-18 DIAGNOSIS — C50919 Malignant neoplasm of unspecified site of unspecified female breast: Secondary | ICD-10-CM

## 2013-05-18 DIAGNOSIS — C50912 Malignant neoplasm of unspecified site of left female breast: Secondary | ICD-10-CM

## 2013-05-18 LAB — CBC WITH DIFFERENTIAL/PLATELET
BASO%: 0.3 % (ref 0.0–2.0)
EOS%: 1.6 % (ref 0.0–7.0)
Eosinophils Absolute: 0.1 10*3/uL (ref 0.0–0.5)
HCT: 35.4 % (ref 34.8–46.6)
LYMPH%: 28.7 % (ref 14.0–49.7)
MCH: 30.2 pg (ref 25.1–34.0)
MCHC: 33.4 g/dL (ref 31.5–36.0)
MCV: 90.5 fL (ref 79.5–101.0)
MONO#: 0.6 10*3/uL (ref 0.1–0.9)
MONO%: 10.1 % (ref 0.0–14.0)
NEUT#: 3.5 10*3/uL (ref 1.5–6.5)
NEUT%: 59.3 % (ref 38.4–76.8)
Platelets: 219 10*3/uL (ref 145–400)
RBC: 3.91 10*6/uL (ref 3.70–5.45)

## 2013-05-18 LAB — COMPREHENSIVE METABOLIC PANEL (CC13)
ALT: 27 U/L (ref 0–55)
Alkaline Phosphatase: 45 U/L (ref 40–150)
Anion Gap: 7 mEq/L (ref 3–11)
CO2: 28 mEq/L (ref 22–29)
Creatinine: 0.8 mg/dL (ref 0.6–1.1)
Sodium: 141 mEq/L (ref 136–145)
Total Bilirubin: 0.4 mg/dL (ref 0.20–1.20)
Total Protein: 7.1 g/dL (ref 6.4–8.3)

## 2013-05-18 MED ORDER — INFLUENZA VAC SPLIT QUAD 0.5 ML IM SUSP
0.5000 mL | INTRAMUSCULAR | Status: DC
  Filled 2013-05-18: qty 0.5

## 2013-05-25 ENCOUNTER — Encounter: Payer: Self-pay | Admitting: Oncology

## 2013-05-25 ENCOUNTER — Telehealth: Payer: Self-pay | Admitting: *Deleted

## 2013-05-25 ENCOUNTER — Ambulatory Visit (HOSPITAL_BASED_OUTPATIENT_CLINIC_OR_DEPARTMENT_OTHER): Payer: Medicare Other | Admitting: Physician Assistant

## 2013-05-25 ENCOUNTER — Encounter: Payer: Self-pay | Admitting: Physician Assistant

## 2013-05-25 VITALS — BP 152/68 | HR 74 | Temp 98.2°F | Resp 18 | Ht 61.0 in | Wt 178.7 lb

## 2013-05-25 DIAGNOSIS — M67431 Ganglion, right wrist: Secondary | ICD-10-CM

## 2013-05-25 DIAGNOSIS — M67439 Ganglion, unspecified wrist: Secondary | ICD-10-CM | POA: Insufficient documentation

## 2013-05-25 DIAGNOSIS — C50919 Malignant neoplasm of unspecified site of unspecified female breast: Secondary | ICD-10-CM

## 2013-05-25 DIAGNOSIS — Z170 Estrogen receptor positive status [ER+]: Secondary | ICD-10-CM

## 2013-05-25 DIAGNOSIS — C50912 Malignant neoplasm of unspecified site of left female breast: Secondary | ICD-10-CM

## 2013-05-25 MED ORDER — TAMOXIFEN CITRATE 20 MG PO TABS: 20.0000 mg | ORAL_TABLET | Freq: Every day | ORAL | Status: DC

## 2013-05-25 NOTE — Telephone Encounter (Signed)
appts made and printed...td 

## 2013-05-25 NOTE — Progress Notes (Signed)
ID: Melinda Hudson   DOB: 06/12/1934  MR#: [id number]  CSN#:[id number]  PCP:  Melinda Qua, MD GYN: SURG:  Melinda Bouillon, MD OTHER:    CHIEF COMPLAINT:  Left Breast Cancer   HISTORY OF PRESENT ILLNESS:  The patient had mammography in [facility], which showed some architectural distortion in dense breasts.  She had an ultrasound there as well, but I do not have that report.  She was referred to [facility] for further evaluation, and on 07/06 Dr. Deboraha Hudson performed left breast ultrasound, which showed an ill defined solid mass at 12 o'clock, 2 cm from the left nipple meaasuring 2.8 cm.  Ultrasound of the left axilla showed a lymph node with thickened cortex suspicious for metastatic deposit.  Both the breast mass and the lymph node were biopsied that day, and the pathology [phone]) showed the mass to be an invasive ductal carcinoma with abundant mucin, low grade, strongly estrogen and progesterone receptor positive, HER-2 negative with a low proliferation fraction at 5%.  The lymph node biopsy was negative.  With this information the patient was referred to Dr. Luisa Hudson, and bilateral breast MRIs were obtained on 07/10.  This showed a 3.6 cm lobulated enhancing mass in the central left breast with no additional masses.  The mildly enlarged left axillary lymph node previously biopsied was again seen by MRI.  It measured 2.1 cm.  There were no other abnormal appearing lymph nodes.  There was a small cyst incidentally noted in the left lobe of the liver.  The patient has refused surgery. Her subsequent history is as detailed below.  INTERVAL HISTORY: Melinda Hudson returns alone today for followup of  Her left breast cancer. She continues on tamoxifen with good tolerance. She has some slight vaginal discharge, clear and nonodorous. She denies any signs of vaginal bleeding. She has occasional hot flashes, but nothing that is problematic for her. She's had no peripheral swelling, or evidence of  abnormal blood clots. She's had no change in vision.   Up to this point, Melinda Hudson has declined definitive surgery to remove the tumor in the left breast. We have been obtaining diagnostic mammograms of the left breast every 6 months, and in fact she is due for another left mammogram at this time. (These are done at [facility].)  Melinda Hudson is beginning to "think more about surgery" and had lots of questions today. She seems to be concerned about the fact that if she had surgery, then she would be required to receive chemotherapy and/or radiation and we did spend some time discussing this today.  REVIEW OF SYSTEMS: Physically, Melinda Hudson has had no recent illnesses and denies fevers, chills, or night sweats. She's had no skin changes and denies any abnormal bruising or bleeding. Her energy level is fairly good, as is her appetite. She's had no nausea, emesis, or change in bowel or bladder habits. She denies any cough, shortness of breath, chest pain, palpitations. She's had no abnormal headaches or dizziness. She currently denies any unusual myalgias, arthralgias, or bony pain. She does have a "knot" on her wrist she wanted me to look at.  A detailed review of systems is otherwise stable and noncontributory.   PAST MEDICAL HISTORY: Past Medical History  Diagnosis Date  . Cancer   . Breast cancer   Right knee arthroscopy remotely under Dr. Thurston Hudson, history of hypertension, history of seasonal allergies, status post TAH/BSO with a prior history of fibroids, status post tonsillectomy and adenoidectomy, status post lumbar laminectomy under Melinda Hudson,  status post bilateral cataract surgery, and history of hypercholesterolemia.  FAMILY HISTORY The patient's father died from a myocardial infarction at age 72.  The patient's mother died with heart problems at age 7.  The patient is one of 12 siblings, 3 sisters and 9 brothers.  There is no history of breast or ovarian cancer in the immediate family,  although 2 of the patient's mother's sisters out of 4 had breast cancer late in life.  GYNECOLOGIC HISTORY: The patient is GX, P0.  She took hormone replacement after her hysterectomy until about 2005.  SOCIAL HISTORY:  (Updated December 2014) She used to live in [place], and worked for [employer].  When she retired in 2001, she moved back to [place].  She is a widow, and lives in [facility], which is a home for senior citizens.  She has an independent apartment, but if she needed nursing home services, it would be available through the same facility.  Her sister, Melinda Hudson, is present today.  She also lives in [place].  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  In case of any problems, Melinda Hudson would like [place] to contact Melinda Hudson, and Melinda Hudson's home number is [phone], and her cell number is [phone].   ADVANCED DIRECTIVES: in place  HEALTH MAINTENANCE: (Updated December 2014)  History  Substance Use Topics  . Smoking status: Never Smoker   . Smokeless tobacco: Not on file  . Alcohol Use: No     Colonoscopy: 2007/[facility]  PAP: s/p hysterectomy  Bone density: May 2010/ normal  Lipid panel: "good", Dr. Abner Hudson  Allergies  Allergen Reactions  . Sulfa Antibiotics Rash    Current Outpatient Prescriptions  Medication Sig Dispense Refill  . Calcium Carbonate-Vitamin D (CALTRATE 600+D) 600-400 MG-UNIT per tablet Take 1 tablet by mouth daily.        . fluticasone (FLONASE) 50 MCG/ACT nasal spray Place 2 sprays into the nose daily. allergies      . furosemide (LASIX) 40 MG tablet Take 40 mg by mouth daily.        Marland Kitchen lisinopril-hydrochlorothiazide (PRINZIDE,ZESTORETIC) 20-25 MG per tablet Take 1 tablet by mouth daily.        Marland Kitchen lovastatin (MEVACOR) 40 MG tablet Take 40 mg by mouth at bedtime.        . Multiple Vitamins-Minerals (CENTRUM SILVER PO) Take 1 tablet by mouth daily.        Marland Kitchen omeprazole (PRILOSEC) 20 MG capsule       . potassium chloride  SA (K-DUR,KLOR-CON) 20 MEQ tablet Take 20 mEq by mouth daily.        . sucralfate (CARAFATE) 1 G tablet       . tamoxifen (NOLVADEX) 20 MG tablet TAKE 1 TABLET BY MOUTH DAILY  90 tablet  0  . FLUARIX injection        No current facility-administered medications for this visit.   Facility-Administered Medications Ordered in Other Visits  Medication Dose Route Frequency Provider Last Rate Last Dose  . influenza vac split quadrivalent PF (FLUARIX) injection 0.5 mL  0.5 mL Intramuscular Tomorrow-1000 Lowella Dell, MD        OBJECTIVE: Middle-aged [place] American woman, who appears well and is in no acute distress Filed Vitals:   05/25/13 1349  BP: 152/68  Pulse: 74  Temp: 98.2 F (36.8 C)  Resp: 18     Body mass index is 33.78 kg/(m^2).    ECOG FS: 0 Filed Weights   05/25/13 1349  Weight: 178 lb 11.2 oz (81.058 kg)   Physical Exam: HEENT:  Sclerae anicteric.  Oropharynx clear. No ulcerations, no evidence of candidiasis. Buccal mucosa is pink and moist. NODES:  No cervical or supraclavicular lymphadenopathy palpated.  BREAST EXAM: Right breast is unremarkable. I was unable to palpate a distinct mass in the left breast today. There no obvious skin changes, no sign of nipple inversion, no nipple discharge. Axillae are benign bilaterally, with no palpable lymphadenopathy. LUNGS:  Clear to auscultation bilaterally.  No wheezes or rhonchi, good excursion bilaterally HEART:  Regular rate and rhythm. No murmur  ABDOMEN:  Soft, nontender. No organomegaly palpated. Positive bowel sounds.  MSK:  No focal spinal tenderness to palpation. Full range of motion bilaterally in the upper extremities. No joint swelling. There is a visible and palpable nodule on the right wrist, approximately 1 cm in diameter and consistent with a ganglion cyst. There no skin changes, and there is no tenderness to palpation. EXTREMITIES:  No peripheral edema.   NEURO:  Nonfocal. Well oriented.  Positive  affect.    LAB RESULTS: Lab Results  Component Value Date   WBC 5.8 05/18/2013   NEUTROABS 3.5 05/18/2013   HGB 11.8 05/18/2013   HCT 35.4 05/18/2013   MCV 90.5 05/18/2013   PLT 219 05/18/2013      Chemistry      Component Value Date/Time   NA 141 05/18/2013 1157   NA 138 11/01/2011 1321   K 3.9 05/18/2013 1157   K 3.6 11/01/2011 1321   CL 104 10/21/2012 1113   CL 103 11/01/2011 1321   CO2 28 05/18/2013 1157   CO2 26 11/01/2011 1321   BUN 11.1 05/18/2013 1157   BUN 13 11/01/2011 1321   CREATININE 0.8 05/18/2013 1157   CREATININE 0.96 11/01/2011 1321      Component Value Date/Time   CALCIUM 9.4 05/18/2013 1157   CALCIUM 9.3 11/01/2011 1321   ALKPHOS 45 05/18/2013 1157   ALKPHOS 40 11/01/2011 1321   AST 32 05/18/2013 1157   AST 25 11/01/2011 1321   ALT 27 05/18/2013 1157   ALT 22 11/01/2011 1321   BILITOT 0.40 05/18/2013 1157   BILITOT 0.4 11/01/2011 1321       STUDIES: Result Narrative  *RADIOLOGY REPORT*  Bilateral digital diagnostic mammograms,  May 2014  History:  Known breast carcinoma on the left; the patient undergoing tamoxifen therapy  Comparison:  December 05, 2010; October 09, 2011; left breast April 29, 2012  Findings:  There is heterogeneously dense fibroglandular change bilaterally.  No well-defined mass on the left is not seen at this time.  An area of mild asymmetry on the left medially near the chest wall remains stable.  Currently there is no dominant mass. There are benign-appearing calcifications bilaterally but no clustered microcalcifications are seen on either side.  There is no architectural distortion.  There is no abnormal skin thickening.  Conclusion:  BIRADS 6.: known biopsied - proven malignancy. Appropriate action should be taken. There is no new lesion.  The previously noted mass on the left is not appreciable as a discrete mass and appears essentially stable compared to more recent prior studies.  No new area of neoplasm appreciated.  No change  on the right without mammographic evidence of malignancy on the right.  Annual mammographic surveillance remains advisable given the patient's age and history of carcinoma on the left.  Any change in physical examination would warrant prompt imaging correlation. These findings and recommendations were discussed with  the patient at the time of this visit.  Original Report Authenticated By: Bretta Bang, M.D.   ASSESSMENT: 79 y.o.  [facility] woman status post left breast biopsy in July 2011 for clinical T2 N0, Stage IIA invasive ductal carcinoma, grade 1, strongly estrogen and receptor positive, both at 100%, HER2/neu negative, with low MIB-1 of 5%. A left axillary lymph node biopsied at the time of initial diagnosis was negative.   (1) Declined surgery.   (2) On neoadjuvant letrozole between July 2011 and October 2012. MRI at that time showed some evidence of tumor growth, letrozole was discontinued   (3) started on tamoxifen in early October 2012, MRI February 2013 showing a significant response.    PLAN:  Over half of our 40 minute appointment today was spent discussing Melinda Hudson's treatment options, and again going over the need for surgery to remove the known left breast cancer. We also discussed the possible need for radiation and/or chemotherapy. She understands that surgery and radiation are both considered local treatments, while the tamoxifen and chemotherapy would be considered systemic. We discussed the fact that these therapies are given in the hopes of preventing a disease recurrence or spread in the future, and I explained that choosing not to pursue radiation or chemotherapy would not preclude her from having breast surgery. I encouraged her to pursue surgery when she is ready, regardless of whether or not she wants to pursue any additional therapy beyond the tamoxifen.  Melinda Hudson would like to meet with Dr. Luisa Hudson again for a consult regarding possible left breast surgery.  She's also due for her next diagnostic left mammogram, and we will try to get that scheduled in December, with an appointment with Dr. Luisa Hudson in January when she has those results.  Otherwise, she will continue on tamoxifen as before, and I have given her a refill on this medication today. She'll be due for her next annual mammogram in May of next year, and will see [place] soon thereafter in June. If, however, she chooses to have surgery prior that time, we will certainly see her sooner.  With regards to the apparent ganglion cyst on the right wrist, she will followup with her primary care physician, Dr. Mikey Bussing, if this becomes bothersome.  [place]ta voices understanding and agreement with the above plan, and knows to call with any changes or problems prior to her next appointment.  Melinda Rideaux PA-C     05/25/2013

## 2013-06-29 ENCOUNTER — Other Ambulatory Visit: Payer: Self-pay | Admitting: Oncology

## 2013-06-29 DIAGNOSIS — C50919 Malignant neoplasm of unspecified site of unspecified female breast: Secondary | ICD-10-CM

## 2013-06-30 ENCOUNTER — Telehealth: Payer: Self-pay | Admitting: *Deleted

## 2013-06-30 NOTE — Telephone Encounter (Signed)
sw pt gv appt for mammogram @ [facility] for 10/30/13@ 10am..pt is aware...td

## 2013-07-13 ENCOUNTER — Ambulatory Visit (INDEPENDENT_AMBULATORY_CARE_PROVIDER_SITE_OTHER): Payer: Self-pay | Admitting: Surgery

## 2013-08-24 ENCOUNTER — Ambulatory Visit (INDEPENDENT_AMBULATORY_CARE_PROVIDER_SITE_OTHER): Payer: Self-pay | Admitting: Surgery

## 2013-10-29 ENCOUNTER — Other Ambulatory Visit: Payer: Self-pay | Admitting: *Deleted

## 2013-11-23 ENCOUNTER — Telehealth: Payer: Self-pay | Admitting: Oncology

## 2013-11-23 ENCOUNTER — Ambulatory Visit (HOSPITAL_BASED_OUTPATIENT_CLINIC_OR_DEPARTMENT_OTHER): Payer: Medicare Other | Admitting: Oncology

## 2013-11-23 ENCOUNTER — Other Ambulatory Visit (HOSPITAL_BASED_OUTPATIENT_CLINIC_OR_DEPARTMENT_OTHER): Payer: Medicare Other

## 2013-11-23 VITALS — BP 121/66 | HR 76 | Temp 97.8°F | Resp 18 | Ht 61.0 in | Wt 169.5 lb

## 2013-11-23 DIAGNOSIS — C50919 Malignant neoplasm of unspecified site of unspecified female breast: Secondary | ICD-10-CM

## 2013-11-23 DIAGNOSIS — C50912 Malignant neoplasm of unspecified site of left female breast: Secondary | ICD-10-CM

## 2013-11-23 DIAGNOSIS — Z170 Estrogen receptor positive status [ER+]: Secondary | ICD-10-CM

## 2013-11-23 LAB — COMPREHENSIVE METABOLIC PANEL (CC13)
ALT: 28 U/L (ref 0–55)
ANION GAP: 14 meq/L — AB (ref 3–11)
AST: 32 U/L (ref 5–34)
Albumin: 4.1 g/dL (ref 3.5–5.0)
Alkaline Phosphatase: 49 U/L (ref 40–150)
BUN: 22.4 mg/dL (ref 7.0–26.0)
CALCIUM: 9.3 mg/dL (ref 8.4–10.4)
CHLORIDE: 102 meq/L (ref 98–109)
CO2: 24 meq/L (ref 22–29)
Creatinine: 1 mg/dL (ref 0.6–1.1)
Glucose: 87 mg/dl (ref 70–140)
Potassium: 3.7 mEq/L (ref 3.5–5.1)
SODIUM: 140 meq/L (ref 136–145)
TOTAL PROTEIN: 7.1 g/dL (ref 6.4–8.3)
Total Bilirubin: 0.36 mg/dL (ref 0.20–1.20)

## 2013-11-23 LAB — CBC WITH DIFFERENTIAL/PLATELET
BASO%: 0.3 % (ref 0.0–2.0)
Basophils Absolute: 0 10*3/uL (ref 0.0–0.1)
EOS%: 0.9 % (ref 0.0–7.0)
Eosinophils Absolute: 0.1 10*3/uL (ref 0.0–0.5)
HEMATOCRIT: 35.5 % (ref 34.8–46.6)
HGB: 11.6 g/dL (ref 11.6–15.9)
LYMPH%: 22.8 % (ref 14.0–49.7)
MCH: 29.7 pg (ref 25.1–34.0)
MCHC: 32.8 g/dL (ref 31.5–36.0)
MCV: 90.6 fL (ref 79.5–101.0)
MONO#: 0.5 10*3/uL (ref 0.1–0.9)
MONO%: 7.6 % (ref 0.0–14.0)
NEUT#: 4.6 10*3/uL (ref 1.5–6.5)
NEUT%: 68.4 % (ref 38.4–76.8)
Platelets: 236 10*3/uL (ref 145–400)
RBC: 3.92 10*6/uL (ref 3.70–5.45)
RDW: 13.7 % (ref 11.2–14.5)
WBC: 6.8 10*3/uL (ref 3.9–10.3)
lymph#: 1.5 10*3/uL (ref 0.9–3.3)

## 2013-11-23 MED ORDER — TAMOXIFEN CITRATE 20 MG PO TABS: 20.0000 mg | ORAL_TABLET | Freq: Every day | ORAL | Status: DC

## 2013-11-23 NOTE — Progress Notes (Signed)
ID: Melinda Hudson   DOB: 1933-08-05  MR#: [id number]  HUT#:[id number]  PCP:  Raelene Bott, MD GYN: SURG:  Erroll Luna, MD OTHER:  Abbe Amsterdam MD  CHIEF COMPLAINT:  Left Breast Cancer   HISTORY OF PRESENT ILLNESS:  The patient had mammography in [facility], which showed some architectural distortion in dense breasts.  She had an ultrasound there as well, but I do not have that report.  She was referred to [facility] for further evaluation, and on 07/06 Dr. Sadie Haber performed left breast ultrasound, which showed an ill defined solid mass at 12 o'clock, 2 cm from the left nipple meaasuring 2.8 cm.  Ultrasound of the left axilla showed a lymph node with thickened cortex suspicious for metastatic deposit.  Both the breast mass and the lymph node were biopsied that day, and the pathology [id number]) showed the mass to be an invasive ductal carcinoma with abundant mucin, low grade, strongly estrogen and progesterone receptor positive, HER-2 negative with a low proliferation fraction at 5%.  The lymph node biopsy was negative.  With this information the patient was referred to Dr. Brantley Stage, and bilateral breast MRIs were obtained on 07/10.  This showed a 3.6 cm lobulated enhancing mass in the central left breast with no additional masses.  The mildly enlarged left axillary lymph node previously biopsied was again seen by MRI.  It measured 2.1 cm.  There were no other abnormal appearing lymph nodes.  There was a small cyst incidentally noted in the left lobe of the liver.  The patient has refused surgery. Her subsequent history is as detailed below.  INTERVAL HISTORY: Melinda Hudson returns alone today for followup of her left breast cancer. The interval history is unremarkable. She is tolerating tamoxifen well, and specifically denies problems with significant hot flashes, vaginal dryness or wetness, or postmenopausal bleeding. She tells me her sister recently had a cardiac stent placed but  is doing well  REVIEW OF SYSTEMS: About 2 weeks ago surely noted a swelling under her left elbow. She brought it to her primary care doctor's attention and he started her on doxycycline, encases might be related to an insect bite. It actually has gotten little bit larger and she is scheduled to see Dr.Kothapalli soon for further evaluation. She denies a fever or rash so. She does of osteoarthritis involving the back, knees and hands, but those symptoms have not changed. A detailed review of systems today was otherwise noncontributory  PAST MEDICAL HISTORY: Past Medical History  Diagnosis Date  . Cancer   . Breast cancer   Right knee arthroscopy remotely under Dr. Noemi Chapel, history of hypertension, history of seasonal allergies, status post TAH/BSO with a prior history of fibroids, status post tonsillectomy and adenoidectomy, status post lumbar laminectomy under [name], status post bilateral cataract surgery, and history of hypercholesterolemia.  FAMILY HISTORY The patient's father died from a myocardial infarction at age 62.  The patient's mother died with heart problems at age 60.  The patient is one of 12 siblings, 3 sisters and 9 brothers.  There is no history of breast or ovarian cancer in the immediate family, although 2 of the patient's mother's sisters out of 43 had breast cancer late in life.  GYNECOLOGIC HISTORY: The patient is GX, P0.  She took hormone replacement after her hysterectomy until about 2005.  SOCIAL HISTORY:  (Updated December 2014) She used to live in [place], and worked for [employer].  When she retired in 2001, she moved back to [place].  She is a widow, and lives in [facility], which is a home for senior citizens.  She has an independent apartment, but if she needed nursing home services, it would be available through the same facility.  Her sister, [name], is present today.  She also lives in [place].  She is a former Buyer, retail.  In case of any problems, Nyjai would like [place] to contact Delois, and Delois's home number is [phone], and her cell number is [phone].   ADVANCED DIRECTIVES: in place  HEALTH MAINTENANCE: (Updated December 2014)  History  Substance Use Topics  . Smoking status: Never Smoker   . Smokeless tobacco: Not on file  . Alcohol Use: No     Colonoscopy: 2007/Kothapalli  PAP: s/p hysterectomy  Bone density: May 2010/ normal  Lipid panel: "good", Dr. Charlett Blake  Allergies  Allergen Reactions  . Sulfa Antibiotics Rash    Current Outpatient Prescriptions  Medication Sig Dispense Refill  . Calcium Carbonate-Vitamin D (CALTRATE 600+D) 600-400 MG-UNIT per tablet Take 1 tablet by mouth daily.        Marland Kitchen FLUARIX injection       . fluticasone (FLONASE) 50 MCG/ACT nasal spray Place 2 sprays into the nose daily. allergies      . furosemide (LASIX) 40 MG tablet Take 40 mg by mouth daily.        Marland Kitchen lisinopril-hydrochlorothiazide (PRINZIDE,ZESTORETIC) 20-25 MG per tablet Take 1 tablet by mouth daily.        Marland Kitchen lovastatin (MEVACOR) 40 MG tablet Take 40 mg by mouth at bedtime.        . Multiple Vitamins-Minerals (CENTRUM SILVER PO) Take 1 tablet by mouth daily.        Marland Kitchen omeprazole (PRILOSEC) 20 MG capsule       . potassium chloride SA (K-DUR,KLOR-CON) 20 MEQ tablet Take 20 mEq by mouth daily.        . sucralfate (CARAFATE) 1 G tablet       . tamoxifen (NOLVADEX) 20 MG tablet Take 1 tablet (20 mg total) by mouth daily.  90 tablet  3   No current facility-administered medications for this visit.   Facility-Administered Medications Ordered in Other Visits  Medication Dose Route Frequency Provider Last Rate Last Dose  . influenza vac split quadrivalent PF (FLUARIX) injection 0.5 mL  0.5 mL Intramuscular Tomorrow-1000 Chauncey Cruel, MD        OBJECTIVE: Middle-aged African American woman in no acute distress Filed Vitals:   11/23/13 1456  BP: 121/66  Pulse: 76  Temp: 97.8 F  (36.6 C)  Resp: 18     Body mass index is 32.04 kg/(m^2).    ECOG FS: 1 Filed Weights   11/23/13 1456  Weight: 169 lb 8 oz (76.885 kg)   Sclerae unicteric, pupils round and equal Oropharynx clear and moist No cervical or supraclavicular adenopathy Lungs no rales or rhonchi Heart regular rate and rhythm Abd soft, nontender, positive bowel sounds MSK no focal spinal tenderness, no upper extremity lymphedema; some arthritic changes over the hands as previously noted; large effusion on the volar aspect of the left elbow extending over approximately 12 cm x 5 cm. This is nontender, nonerythematous, not warm to palpation. Neuro: nonfocal, well oriented, appropriate affect Breasts: The right breast is unremarkable. I do not palpate a well-defined mass in the left breast. There are no skin or nipple changes of concern. The left axilla is benign.  LAB RESULTS: Lab Results  Component Value Date   WBC 6.8 11/23/2013   NEUTROABS 4.6 11/23/2013   HGB 11.6 11/23/2013   HCT 35.5 11/23/2013   MCV 90.6 11/23/2013   PLT 236 11/23/2013      Chemistry      Component Value Date/Time   NA 140 11/23/2013 1432   NA 138 11/01/2011 1321   K 3.7 11/23/2013 1432   K 3.6 11/01/2011 1321   CL 104 10/21/2012 1113   CL 103 11/01/2011 1321   CO2 24 11/23/2013 1432   CO2 26 11/01/2011 1321   BUN 22.4 11/23/2013 1432   BUN 13 11/01/2011 1321   CREATININE 1.0 11/23/2013 1432   CREATININE 0.96 11/01/2011 1321      Component Value Date/Time   CALCIUM 9.3 11/23/2013 1432   CALCIUM 9.3 11/01/2011 1321   ALKPHOS 49 11/23/2013 1432   ALKPHOS 40 11/01/2011 1321   AST 32 11/23/2013 1432   AST 25 11/01/2011 1321   ALT 28 11/23/2013 1432   ALT 22 11/01/2011 1321   BILITOT 0.36 11/23/2013 1432   BILITOT 0.4 11/01/2011 1321       STUDIES: Mammography last month showed no significant change (study none available today for review)   ASSESSMENT: 80 y.o.  [facility] woman status post left breast biopsy in July 2011 for clinical T2 N0, Stage IIA invasive  ductal carcinoma, grade 1, strongly estrogen and receptor positive, both at 100%, HER2/neu negative, with low MIB-1 of 5%. A left axillary lymph node biopsied at the time of initial diagnosis was negative.   (1) Declined surgery.   (2) On neoadjuvant letrozole between July 2011 and October 2012. MRI at that time showed some evidence of tumor growth, letrozole was discontinued   (3) started on tamoxifen in early October 2012, with continuing response response.    PLAN:  Melinda Hudson is tolerating the tamoxifen well and she is continuing to have evidence of response. I am making no changes in her treatment. She is going to return to see me early November for routine followup.  I don't have a simple explanation as to the fluid collection in her left elbow. It may be trauma. There is discoloration under the right elbow as well. Apparently she leans forward in her chair when watching TV. At any rate this appears benign and not associated with an inflammatory arthritis much less with breast cancer.  She knows to call for any problems that may develop before her next visit here.  Chauncey Cruel, MD      11/23/2013

## 2013-11-23 NOTE — Telephone Encounter (Signed)
, °

## 2013-11-30 ENCOUNTER — Encounter: Payer: Self-pay | Admitting: Oncology

## 2014-05-10 ENCOUNTER — Telehealth: Payer: Self-pay | Admitting: Oncology

## 2014-05-10 ENCOUNTER — Ambulatory Visit (HOSPITAL_BASED_OUTPATIENT_CLINIC_OR_DEPARTMENT_OTHER): Payer: Medicare Other | Admitting: Oncology

## 2014-05-10 ENCOUNTER — Other Ambulatory Visit (HOSPITAL_BASED_OUTPATIENT_CLINIC_OR_DEPARTMENT_OTHER): Payer: Medicare Other

## 2014-05-10 VITALS — BP 127/66 | HR 78 | Temp 98.4°F | Resp 18 | Ht 61.0 in | Wt 169.4 lb

## 2014-05-10 DIAGNOSIS — C50919 Malignant neoplasm of unspecified site of unspecified female breast: Secondary | ICD-10-CM

## 2014-05-10 DIAGNOSIS — C50912 Malignant neoplasm of unspecified site of left female breast: Secondary | ICD-10-CM

## 2014-05-10 DIAGNOSIS — C50112 Malignant neoplasm of central portion of left female breast: Secondary | ICD-10-CM

## 2014-05-10 DIAGNOSIS — Z170 Estrogen receptor positive status [ER+]: Secondary | ICD-10-CM

## 2014-05-10 LAB — COMPREHENSIVE METABOLIC PANEL (CC13)
ALK PHOS: 44 U/L (ref 40–150)
ALT: 28 U/L (ref 0–55)
AST: 33 U/L (ref 5–34)
Albumin: 4.2 g/dL (ref 3.5–5.0)
Anion Gap: 10 mEq/L (ref 3–11)
BUN: 15.7 mg/dL (ref 7.0–26.0)
CHLORIDE: 103 meq/L (ref 98–109)
CO2: 26 mEq/L (ref 22–29)
CREATININE: 0.9 mg/dL (ref 0.6–1.1)
Calcium: 9.8 mg/dL (ref 8.4–10.4)
Glucose: 89 mg/dl (ref 70–140)
Potassium: 4.2 mEq/L (ref 3.5–5.1)
Sodium: 139 mEq/L (ref 136–145)
Total Bilirubin: 0.37 mg/dL (ref 0.20–1.20)
Total Protein: 7.5 g/dL (ref 6.4–8.3)

## 2014-05-10 LAB — CBC WITH DIFFERENTIAL/PLATELET
BASO%: 0.1 % (ref 0.0–2.0)
BASOS ABS: 0 10*3/uL (ref 0.0–0.1)
EOS%: 1.2 % (ref 0.0–7.0)
Eosinophils Absolute: 0.1 10*3/uL (ref 0.0–0.5)
HCT: 35.7 % (ref 34.8–46.6)
HEMOGLOBIN: 11.8 g/dL (ref 11.6–15.9)
LYMPH#: 1.5 10*3/uL (ref 0.9–3.3)
LYMPH%: 20.9 % (ref 14.0–49.7)
MCH: 29.6 pg (ref 25.1–34.0)
MCHC: 33.1 g/dL (ref 31.5–36.0)
MCV: 89.5 fL (ref 79.5–101.0)
MONO#: 0.6 10*3/uL (ref 0.1–0.9)
MONO%: 8.8 % (ref 0.0–14.0)
NEUT#: 5 10*3/uL (ref 1.5–6.5)
NEUT%: 69 % (ref 38.4–76.8)
Platelets: 210 10*3/uL (ref 145–400)
RBC: 3.99 10*6/uL (ref 3.70–5.45)
RDW: 13.7 % (ref 11.2–14.5)
WBC: 7.2 10*3/uL (ref 3.9–10.3)

## 2014-05-10 MED ORDER — TAMOXIFEN CITRATE 20 MG PO TABS: 20.0000 mg | ORAL_TABLET | Freq: Every day | ORAL | Status: DC

## 2014-05-10 NOTE — Progress Notes (Signed)
ID: Melinda Hudson   DOB: 23-Aug-1933  MR#: [id number]  GUY#:[id number]  PCP:  Raelene Bott, MD GYN: SURG:  Erroll Luna, MD OTHER:  Abbe Amsterdam MD  CHIEF COMPLAINT:  Left Breast Cancer  CURRENT TREATMENT: Tamoxifen   HISTORY OF PRESENT ILLNESS: From the original intake note:  The patient had mammography in [facility], which showed some architectural distortion in dense breasts.  She had an ultrasound there as well, but I do not have that report.  She was referred to [facility] for [facility] evaluation, and on 07/06 Dr. Sadie Haber performed left breast ultrasound, which showed an ill defined solid mass at 12 o'clock, 2 cm from the left nipple meaasuring 2.8 cm.  Ultrasound of the left axilla showed a lymph node with thickened cortex suspicious for metastatic deposit.  Both the breast mass and the lymph node were biopsied that day, and the pathology [id number]) showed the mass to be an invasive ductal carcinoma with abundant mucin, low grade, strongly estrogen and progesterone receptor positive, HER-2 negative with a low proliferation fraction at 5%.  The lymph node biopsy was negative.  With this information the patient was referred to Dr. Brantley Stage, and bilateral breast MRIs were obtained on 07/10.  This showed a 3.6 cm lobulated enhancing mass in the central left breast with no additional masses.  The mildly enlarged left axillary lymph node previously biopsied was again seen by MRI.  It measured 2.1 cm.  There were no other abnormal appearing lymph nodes.  There was a small cyst incidentally noted in the left lobe of the liver.  The patient has refused surgery. Her subsequent history is as detailed below.  INTERVAL HISTORY: Shelsey returns today for follow-up of her left breast cancer. She continues on tamoxifen. She tolerates it generally well. She has hot flashes, which are worse in the summer, and which don't wake her up. She has some vaginal wetness, for which she wears a  pad. To her these are very minimal symptoms. She also obtains the medication at a very reasonable cost.  REVIEW OF SYSTEMS: She just had her Pneumovax and that caused a little bit of upper arm swelling, but it is already going down. She has joint pains here and there, but they are not more intense or persistent than before. She goes to the gym about once a week but knows that she needs to do it at least 3 times a week. Sometimes in the morning she wakes up with her hands feeling a little numb.Otherwise a detailed review of systems today was entirely stable  PAST MEDICAL HISTORY: Past Medical History  Diagnosis Date  . Cancer   . Breast cancer   Right knee arthroscopy remotely under Dr. Noemi Chapel, history of hypertension, history of seasonal allergies, status post TAH/BSO with a prior history of fibroids, status post tonsillectomy and adenoidectomy, status post lumbar laminectomy under [name], status post bilateral cataract surgery, and history of hypercholesterolemia.  FAMILY HISTORY The patient's father died from a myocardial infarction at age 49.  The patient's mother died with heart problems at age 76.  The patient is one of 12 siblings, 3 sisters and 9 brothers.  There is no history of breast or ovarian cancer in the immediate family, although 2 of the patient's mother's sisters out of 19 had breast cancer late in life.  GYNECOLOGIC HISTORY: The patient is GX, P0.  She took hormone replacement after her hysterectomy until about 2005.  SOCIAL HISTORY:  (Updated December 2014) She used  to live in [place], and worked for [employer].  When she retired in 2001, she moved back to [place].  She is a widow, and lives in [facility], which is a home for senior citizens.  She has an independent apartment, but if she needed nursing home services, it would be available through the same facility.  Her sister, [name], is present today.  She also lives in [place].  She is a  former Engineer, mining.  In case of any problems, Shelma would like [place] to contact Delois, and Delois's home number is [phone], and her cell number is [phone].   ADVANCED DIRECTIVES: in place  HEALTH MAINTENANCE: (Updated December 2014)  History  Substance Use Topics  . Smoking status: Never Smoker   . Smokeless tobacco: Not on file  . Alcohol Use: No     Colonoscopy: 2007/Kothapalli  PAP: s/p hysterectomy  Bone density: May 2010/ normal  Lipid panel: "good", Dr. Charlett Blake  Allergies  Allergen Reactions  . Sulfa Antibiotics Rash    Current Outpatient Prescriptions  Medication Sig Dispense Refill  . Calcium Carbonate-Vitamin D (CALTRATE 600+D) 600-400 MG-UNIT per tablet Take 1 tablet by mouth daily.      Marland Kitchen FLUARIX injection     . fluticasone (FLONASE) 50 MCG/ACT nasal spray Place 2 sprays into the nose daily. allergies    . furosemide (LASIX) 40 MG tablet Take 40 mg by mouth daily.      Marland Kitchen lisinopril-hydrochlorothiazide (PRINZIDE,ZESTORETIC) 20-25 MG per tablet Take 1 tablet by mouth daily.      Marland Kitchen lovastatin (MEVACOR) 40 MG tablet Take 40 mg by mouth at bedtime.      . Multiple Vitamins-Minerals (CENTRUM SILVER PO) Take 1 tablet by mouth daily.      Marland Kitchen omeprazole (PRILOSEC) 20 MG capsule     . potassium chloride SA (K-DUR,KLOR-CON) 20 MEQ tablet Take 20 mEq by mouth daily.      . sucralfate (CARAFATE) 1 G tablet     . tamoxifen (NOLVADEX) 20 MG tablet Take 1 tablet (20 mg total) by mouth daily. 90 tablet 3   No current facility-administered medications for this visit.    OBJECTIVE: Middle-aged [place] American woman Who appears well Filed Vitals:   05/10/14 1528  BP: 127/66  Pulse: 78  Temp: 98.4 F (36.9 C)  Resp: 18     Body mass index is 32.02 kg/(m^2).    ECOG FS: 0 Filed Weights   05/10/14 1528  Weight: 169 lb 6.4 oz (76.839 kg)    Sclerae unicteric, EOMs intact Oropharynx clear, teeth in good repair No cervical or supraclavicular  adenopathy Lungs no rales or rhonchi Heart regular rate and rhythm Abd soft, nontender, positive bowel sounds MSK no focal spinal tenderness, no upper extremity lymphedema; Neuro: nonfocal, well oriented, positive affect Breasts: The right breast is unremarkable. There are no palpable masses in the left breast. There are no skin or nipple changes of concern.The left axilla is benign.  LAB RESULTS: Lab Results  Component Value Date   WBC 7.2 05/10/2014   NEUTROABS 5.0 05/10/2014   HGB 11.8 05/10/2014   HCT 35.7 05/10/2014   MCV 89.5 05/10/2014   PLT 210 05/10/2014      Chemistry      Component Value Date/Time   NA 139 05/10/2014 1441   NA 138 11/01/2011 1321   K 4.2 05/10/2014 1441   K 3.6 11/01/2011 1321   CL 104 10/21/2012 1113   CL 103  11/01/2011 1321   CO2 26 05/10/2014 1441   CO2 26 11/01/2011 1321   BUN 15.7 05/10/2014 1441   BUN 13 11/01/2011 1321   CREATININE 0.9 05/10/2014 1441   CREATININE 0.96 11/01/2011 1321      Component Value Date/Time   CALCIUM 9.8 05/10/2014 1441   CALCIUM 9.3 11/01/2011 1321   ALKPHOS 44 05/10/2014 1441   ALKPHOS 40 11/01/2011 1321   AST 33 05/10/2014 1441   AST 25 11/01/2011 1321   ALT 28 05/10/2014 1441   ALT 22 11/01/2011 1321   BILITOT 0.37 05/10/2014 1441   BILITOT 0.4 11/01/2011 1321       STUDIES: Mammography 11/12/2013 at [facility] showed a stable parenchymal pattern in the region of the prior left breast biopsy. There was no mammographic evidence of an enlarging mass. There were no new masses in the left breast and there was no mass in the right breast. There were no suspicious microcalcifications or distortion in either breast. The axillary regions were unremarkable.   ASSESSMENT: 80 y.o.  [facility] woman status post left breast biopsy in July 2011 for clinical T2 N0, Stage IIA invasive ductal carcinoma, grade 1, strongly estrogen and receptor positive, both at 100%, HER2/neu negative, with low MIB-1 of 5%. A  left axillary lymph node biopsied at the time of initial diagnosis was negative.   (1) Declined surgery.   (2) On neoadjuvant letrozole between July 2011 and October 2012. MRI at that time showed some evidence of tumor growth, letrozole was discontinued   (3) started on tamoxifen in early October 2012, with continuing response    PLAN:  Diavian is doing terrific, with no measurable disease by mammography, and excellent tolerance of the tamoxifen.  She is having some symptoms suggestive of carpal tunnel. Today we discussed her getting a wrist splint. She can try to overnight and see that helps those symptoms.  Otherwise she will have her next mammogram in May and she will return to see me in June of next year. At that point we will consider going on a once a year visit. The plan is to continue tamoxifen for 10 years  Kathelyn has a good understanding of the overall plan. She agrees with it. She knows the goal of treatment in her case is control. She will call with any problems that may develop before the next visit here.  Chauncey Cruel, MD      05/10/2014

## 2014-05-10 NOTE — Telephone Encounter (Signed)
per pof to sch pt appt-gavde pt copy of sch °

## 2014-09-15 ENCOUNTER — Other Ambulatory Visit: Payer: Self-pay | Admitting: *Deleted

## 2014-09-15 DIAGNOSIS — C50912 Malignant neoplasm of unspecified site of left female breast: Secondary | ICD-10-CM

## 2014-09-15 MED ORDER — TAMOXIFEN CITRATE 20 MG PO TABS: 20.0000 mg | ORAL_TABLET | Freq: Every day | ORAL | Status: DC

## 2014-12-02 ENCOUNTER — Other Ambulatory Visit: Payer: Self-pay

## 2014-12-02 ENCOUNTER — Telehealth: Payer: Self-pay | Admitting: *Deleted

## 2014-12-02 DIAGNOSIS — C50912 Malignant neoplasm of unspecified site of left female breast: Secondary | ICD-10-CM

## 2014-12-02 MED ORDER — TAMOXIFEN CITRATE 20 MG PO TABS: 20.0000 mg | ORAL_TABLET | Freq: Every day | ORAL | Status: DC

## 2014-12-02 NOTE — Telephone Encounter (Signed)
[facility] faxed Tamoxifen refill request.  Refilled last on 09-15-2014 for six month supply.  Next F/U scheduled 12-13-2014.  Too soon to refill.  Will hold request until seen at next scheduled visit.

## 2014-12-09 ENCOUNTER — Other Ambulatory Visit: Payer: Self-pay | Admitting: *Deleted

## 2014-12-09 DIAGNOSIS — C50919 Malignant neoplasm of unspecified site of unspecified female breast: Secondary | ICD-10-CM

## 2014-12-13 ENCOUNTER — Telehealth: Payer: Self-pay | Admitting: General Practice

## 2014-12-13 ENCOUNTER — Ambulatory Visit (HOSPITAL_BASED_OUTPATIENT_CLINIC_OR_DEPARTMENT_OTHER): Payer: Medicare Other | Admitting: Oncology

## 2014-12-13 ENCOUNTER — Other Ambulatory Visit (HOSPITAL_BASED_OUTPATIENT_CLINIC_OR_DEPARTMENT_OTHER): Payer: Medicare Other

## 2014-12-13 VITALS — BP 139/68 | HR 73 | Temp 97.5°F | Resp 18 | Ht 61.0 in | Wt 168.9 lb

## 2014-12-13 DIAGNOSIS — C50919 Malignant neoplasm of unspecified site of unspecified female breast: Secondary | ICD-10-CM

## 2014-12-13 DIAGNOSIS — Z853 Personal history of malignant neoplasm of breast: Secondary | ICD-10-CM

## 2014-12-13 DIAGNOSIS — C50912 Malignant neoplasm of unspecified site of left female breast: Secondary | ICD-10-CM

## 2014-12-13 LAB — CBC WITH DIFFERENTIAL/PLATELET
BASO%: 0.3 % (ref 0.0–2.0)
BASOS ABS: 0 10*3/uL (ref 0.0–0.1)
EOS ABS: 0 10*3/uL (ref 0.0–0.5)
EOS%: 0.7 % (ref 0.0–7.0)
HEMATOCRIT: 34.9 % (ref 34.8–46.6)
HGB: 11.5 g/dL — ABNORMAL LOW (ref 11.6–15.9)
LYMPH%: 23.5 % (ref 14.0–49.7)
MCH: 29.7 pg (ref 25.1–34.0)
MCHC: 33 g/dL (ref 31.5–36.0)
MCV: 89.8 fL (ref 79.5–101.0)
MONO#: 0.7 10*3/uL (ref 0.1–0.9)
MONO%: 9.9 % (ref 0.0–14.0)
NEUT%: 65.6 % (ref 38.4–76.8)
NEUTROS ABS: 4.6 10*3/uL (ref 1.5–6.5)
PLATELETS: 223 10*3/uL (ref 145–400)
RBC: 3.89 10*6/uL (ref 3.70–5.45)
RDW: 13.5 % (ref 11.2–14.5)
WBC: 7 10*3/uL (ref 3.9–10.3)
lymph#: 1.6 10*3/uL (ref 0.9–3.3)

## 2014-12-13 LAB — COMPREHENSIVE METABOLIC PANEL (CC13)
ALBUMIN: 4.1 g/dL (ref 3.5–5.0)
ALK PHOS: 52 U/L (ref 40–150)
ALT: 25 U/L (ref 0–55)
AST: 28 U/L (ref 5–34)
Anion Gap: 8 mEq/L (ref 3–11)
BUN: 13.2 mg/dL (ref 7.0–26.0)
CO2: 29 mEq/L (ref 22–29)
Calcium: 9.6 mg/dL (ref 8.4–10.4)
Chloride: 104 mEq/L (ref 98–109)
Creatinine: 0.9 mg/dL (ref 0.6–1.1)
EGFR: 72 mL/min/{1.73_m2} — ABNORMAL LOW (ref >90)
Glucose: 82 mg/dl (ref 70–140)
Potassium: 3.8 mEq/L (ref 3.5–5.1)
Sodium: 141 mEq/L (ref 136–145)
Total Bilirubin: 0.34 mg/dL (ref 0.20–1.20)
Total Protein: 6.8 g/dL (ref 6.4–8.3)

## 2014-12-13 NOTE — Telephone Encounter (Signed)
avs printed for patient,will hold for July 2017 appointment and patient will call for her mammo   Melinda Hudson

## 2014-12-13 NOTE — Progress Notes (Signed)
ID: Melinda Hudson   DOB: 1934-03-18  MR#: [id number]  TRV#:[id number]  PCP:  Raelene Bott, MD GYN: SURG:  Erroll Luna, MD OTHER:  Abbe Amsterdam MD  CHIEF COMPLAINT:  Left Breast Cancer  CURRENT TREATMENT: Tamoxifen   HISTORY OF PRESENT ILLNESS: From the original intake note:  The patient had mammography in [facility], which showed some architectural distortion in dense breasts.  She had an ultrasound there as well, but I do not have that report.  She was referred to [facility] for further evaluation, and on 07/06 Dr. Sadie Haber performed left breast ultrasound, which showed an ill defined solid mass at 12 o'clock, 2 cm from the left nipple meaasuring 2.8 cm.  Ultrasound of the left axilla showed a lymph node with thickened cortex suspicious for metastatic deposit.  Both the breast mass and the lymph node were biopsied that day, and the pathology [id number]) showed the mass to be an invasive ductal carcinoma with abundant mucin, low grade, strongly estrogen and progesterone receptor positive, HER-2 negative with a low proliferation fraction at 5%.  The lymph node biopsy was negative.  With this information the patient was referred to Dr. Brantley Stage, and bilateral breast MRIs were obtained on 07/10.  This showed a 3.6 cm lobulated enhancing mass in the central left breast with no additional masses.  The mildly enlarged left axillary lymph node previously biopsied was again seen by MRI.  It measured 2.1 cm.  There were no other abnormal appearing lymph nodes.  There was a small cyst incidentally noted in the left lobe of the liver.  The patient has refused surgery. Her subsequent history is as detailed below.  INTERVAL HISTORY: Melinda Hudson returns today for follow-up of her left breast cancer. The interval history is generally unremarkable. She does her housework, goes to church, and has just started going to the local gym associated with the senior [facility] where she  lives. She says its very well a portion in the people are very nice.  She continues on tamoxifen. She still has hot flashes, usually about once every morning, but these don't wake her up at night. She also has a vaginal discharge, which is yellow but not itchy or smelly. She wears a pad for that. She obtains the tamoxifen at a dollars for 3 month supply.  REVIEW OF SYSTEMS: A detailed review of systems today was otherwise entirely noncontributory  PAST MEDICAL HISTORY: Past Medical History  Diagnosis Date  . Cancer   . Breast cancer   Right knee arthroscopy remotely under Dr. Noemi Chapel, history of hypertension, history of seasonal allergies, status post TAH/BSO with a prior history of fibroids, status post tonsillectomy and adenoidectomy, status post lumbar laminectomy under [name], status post bilateral cataract surgery, and history of hypercholesterolemia.  FAMILY HISTORY The patient's father died from a myocardial infarction at age 105.  The patient's mother died with heart problems at age 44.  The patient is one of 12 siblings, 3 sisters and 9 brothers.  There is no history of breast or ovarian cancer in the immediate family, although 2 of the patient's mother's sisters out of 50 had breast cancer late in life.  GYNECOLOGIC HISTORY: The patient is GX, P0.  She took hormone replacement after her hysterectomy until about 2005.  SOCIAL HISTORY:  (Updated December 2014) She used to live in [place], and worked for [employer].  When she retired in 2001, she moved back to [place].  She is a widow, and lives  in [facility], which is a home for senior citizens.  She has an independent apartment, but if she needed nursing home services, it would be available through the same facility.  Her sister, [name], is present today.  She also lives in [place].  She is a former Engineer, mining.  In case of any problems, Cathlin would like [place] to contact Delois,  and Delois's home number is [phone], and her cell number is [phone].   ADVANCED DIRECTIVES: in place  HEALTH MAINTENANCE: (Updated December 2014)  History  Substance Use Topics  . Smoking status: Never Smoker   . Smokeless tobacco: Not on file  . Alcohol Use: No     Colonoscopy: 2007/[facility]  PAP: s/p hysterectomy  Bone density: May 2010/ normal  Lipid panel: "good", Dr. Charlett Blake  Allergies  Allergen Reactions  . Sulfa Antibiotics Rash    Current Outpatient Prescriptions  Medication Sig Dispense Refill  . Calcium Carbonate-Vitamin D (CALTRATE 600+D) 600-400 MG-UNIT per tablet Take 1 tablet by mouth daily.      Marland Kitchen FLUARIX injection     . fluticasone (FLONASE) 50 MCG/ACT nasal spray Place 2 sprays into the nose daily. allergies    . furosemide (LASIX) 40 MG tablet Take 40 mg by mouth daily.      Marland Kitchen lisinopril-hydrochlorothiazide (PRINZIDE,ZESTORETIC) 20-25 MG per tablet Take 1 tablet by mouth daily.      Marland Kitchen lovastatin (MEVACOR) 40 MG tablet Take 40 mg by mouth at bedtime.      . Multiple Vitamins-Minerals (CENTRUM SILVER PO) Take 1 tablet by mouth daily.      Marland Kitchen omeprazole (PRILOSEC) 20 MG capsule     . potassium chloride SA (K-DUR,KLOR-CON) 20 MEQ tablet Take 20 mEq by mouth daily.      . sucralfate (CARAFATE) 1 G tablet     . tamoxifen (NOLVADEX) 20 MG tablet Take 1 tablet (20 mg total) by mouth daily. 90 tablet 0   No current facility-administered medications for this visit.    OBJECTIVE: Middle-aged [place] American woman in no acute distress  Filed Vitals:   12/13/14 1307  BP: 139/68  Pulse: 73  Temp: 97.5 F (36.4 C)  Resp: 18     Body mass index is 31.93 kg/(m^2).    ECOG FS: 0 Filed Weights   12/13/14 1307  Weight: 168 lb 14.4 oz (76.613 kg)    Sclerae unicteric, pupils round and equal Oropharynx clear and moist-- no thrush or other lesions No cervical or supraclavicular adenopathy Lungs no rales or rhonchi Heart regular rate and rhythm Abd soft,  nontender, positive bowel sounds MSK no focal spinal tenderness, no upper extremity lymphedema Neuro: nonfocal, well oriented, appropriate affect Breasts: The right breast is unremarkable. The left breast shows no masses, no skin or nipple changes, and no change in contour. The left axilla is benign    LAB RESULTS: Lab Results  Component Value Date   WBC 7.0 12/13/2014   NEUTROABS 4.6 12/13/2014   HGB 11.5* 12/13/2014   HCT 34.9 12/13/2014   MCV 89.8 12/13/2014   PLT 223 12/13/2014      Chemistry      Component Value Date/Time   NA 141 12/13/2014 1233   NA 138 11/01/2011 1321   K 3.8 12/13/2014 1233   K 3.6 11/01/2011 1321   CL 104 10/21/2012 1113   CL 103 11/01/2011 1321   CO2 29 12/13/2014 1233   CO2 26 11/01/2011 1321   BUN 13.2 12/13/2014 1233  BUN 13 11/01/2011 1321   CREATININE 0.9 12/13/2014 1233   CREATININE 0.96 11/01/2011 1321      Component Value Date/Time   CALCIUM 9.6 12/13/2014 1233   CALCIUM 9.3 11/01/2011 1321   ALKPHOS 52 12/13/2014 1233   ALKPHOS 40 11/01/2011 1321   AST 28 12/13/2014 1233   AST 25 11/01/2011 1321   ALT 25 12/13/2014 1233   ALT 22 11/01/2011 1321   BILITOT 0.34 12/13/2014 1233   BILITOT 0.4 11/01/2011 1321       STUDIES: Mammography to be scheduled at [facility] within the next week.  ASSESSMENT: 81 y.o.  [facility] woman status post left breast biopsy in July 2011 for clinical T2 N0, Stage IIA invasive ductal carcinoma, grade 1, strongly estrogen and receptor positive, both at 100%, HER2/neu negative, with low MIB-1 of 5%. A left axillary lymph node biopsied at the time of initial diagnosis was negative.   (1) Declined surgery.   (2) On neoadjuvant letrozole between July 2011 and October 2012. MRI at that time showed some evidence of tumor growth, letrozole was discontinued   (3) started on tamoxifen in early October 2012   PLAN:  Lanett was supposed to have had her mammogram prior to this visit, but apparently  we did not schedule it. She will have it within the next week. Assuming that it again shows no measurable disease, she will see me again in one year, and since that means her next mammogram, in 2017, will be late June she will see me July 2017.  Of course if we see any evidence of growth we will have to proceed to surgery.  She has a good understanding of the overall plan. She agrees with it. She will call with any problems that may develop before her next visit here. Chauncey Cruel, MD      12/13/2014

## 2014-12-15 ENCOUNTER — Other Ambulatory Visit: Payer: Self-pay | Admitting: *Deleted

## 2014-12-15 DIAGNOSIS — C50912 Malignant neoplasm of unspecified site of left female breast: Secondary | ICD-10-CM

## 2015-01-13 ENCOUNTER — Telehealth: Payer: Self-pay | Admitting: *Deleted

## 2015-01-13 NOTE — Telephone Encounter (Signed)
VM message from patient asking if Dr. Jana Hakim has received her mammogram report-she did have it done on 12/21/14 @ [facility].  Did not see it in [place].

## 2015-01-17 ENCOUNTER — Other Ambulatory Visit: Payer: Self-pay | Admitting: Oncology

## 2015-01-17 NOTE — Telephone Encounter (Signed)
I don't see it either-- please request copy and let pt know we will call her w results  Thanks!

## 2015-02-17 ENCOUNTER — Other Ambulatory Visit: Payer: Self-pay | Admitting: Oncology

## 2015-02-17 NOTE — Telephone Encounter (Signed)
Last ov 12/13/14.  Chart reviewed.

## 2015-07-16 ENCOUNTER — Telehealth: Payer: Self-pay | Admitting: Oncology

## 2015-07-16 NOTE — Telephone Encounter (Signed)
Had called patient with her 12/2015 appointment as this sch was not open when she was here 11/2014,i will call for her mammo for her on 1/24 and advise her of that appt

## 2015-07-21 ENCOUNTER — Telehealth: Payer: Self-pay | Admitting: Oncology

## 2015-07-21 NOTE — Telephone Encounter (Signed)
Called patient and left a message with mammo at [facility] for 01/03/16 9:00 and the order has been faxed [phone]

## 2015-12-30 ENCOUNTER — Other Ambulatory Visit: Payer: Self-pay | Admitting: Oncology

## 2016-01-10 ENCOUNTER — Other Ambulatory Visit: Payer: Self-pay | Admitting: *Deleted

## 2016-01-10 DIAGNOSIS — C50912 Malignant neoplasm of unspecified site of left female breast: Secondary | ICD-10-CM

## 2016-01-11 ENCOUNTER — Ambulatory Visit (HOSPITAL_BASED_OUTPATIENT_CLINIC_OR_DEPARTMENT_OTHER): Payer: Medicare Other | Admitting: Oncology

## 2016-01-11 ENCOUNTER — Telehealth: Payer: Self-pay | Admitting: Oncology

## 2016-01-11 ENCOUNTER — Other Ambulatory Visit (HOSPITAL_BASED_OUTPATIENT_CLINIC_OR_DEPARTMENT_OTHER): Payer: Medicare Other

## 2016-01-11 VITALS — BP 144/63 | HR 70 | Temp 98.1°F | Resp 18 | Ht 61.0 in | Wt 164.8 lb

## 2016-01-11 DIAGNOSIS — Z170 Estrogen receptor positive status [ER+]: Secondary | ICD-10-CM | POA: Diagnosis not present

## 2016-01-11 DIAGNOSIS — C50912 Malignant neoplasm of unspecified site of left female breast: Secondary | ICD-10-CM

## 2016-01-11 DIAGNOSIS — Z79810 Long term (current) use of selective estrogen receptor modulators (SERMs): Secondary | ICD-10-CM | POA: Diagnosis not present

## 2016-01-11 LAB — CBC WITH DIFFERENTIAL/PLATELET
BASO%: 0.4 % (ref 0.0–2.0)
Basophils Absolute: 0 10*3/uL (ref 0.0–0.1)
EOS%: 1.5 % (ref 0.0–7.0)
Eosinophils Absolute: 0.1 10*3/uL (ref 0.0–0.5)
HEMATOCRIT: 36.9 % (ref 34.8–46.6)
HEMOGLOBIN: 11.8 g/dL (ref 11.6–15.9)
LYMPH%: 23.1 % (ref 14.0–49.7)
MCH: 29.4 pg (ref 25.1–34.0)
MCHC: 32 g/dL (ref 31.5–36.0)
MCV: 91.7 fL (ref 79.5–101.0)
MONO#: 0.5 10*3/uL (ref 0.1–0.9)
MONO%: 9 % (ref 0.0–14.0)
NEUT%: 66 % (ref 38.4–76.8)
NEUTROS ABS: 3.8 10*3/uL (ref 1.5–6.5)
PLATELETS: 193 10*3/uL (ref 145–400)
RBC: 4.03 10*6/uL (ref 3.70–5.45)
RDW: 13.9 % (ref 11.2–14.5)
WBC: 5.7 10*3/uL (ref 3.9–10.3)
lymph#: 1.3 10*3/uL (ref 0.9–3.3)

## 2016-01-11 LAB — COMPREHENSIVE METABOLIC PANEL
ALBUMIN: 4.1 g/dL (ref 3.5–5.0)
ALK PHOS: 57 U/L (ref 40–150)
ALT: 20 U/L (ref 0–55)
ANION GAP: 8 meq/L (ref 3–11)
AST: 23 U/L (ref 5–34)
BILIRUBIN TOTAL: 0.34 mg/dL (ref 0.20–1.20)
BUN: 24.1 mg/dL (ref 7.0–26.0)
CALCIUM: 9.3 mg/dL (ref 8.4–10.4)
CO2: 28 mEq/L (ref 22–29)
CREATININE: 0.9 mg/dL (ref 0.6–1.1)
Chloride: 105 mEq/L (ref 98–109)
EGFR: 68 mL/min/{1.73_m2} — AB (ref >90)
Glucose: 81 mg/dl (ref 70–140)
Potassium: 3.7 mEq/L (ref 3.5–5.1)
Sodium: 141 mEq/L (ref 136–145)
TOTAL PROTEIN: 7.1 g/dL (ref 6.4–8.3)

## 2016-01-11 MED ORDER — TAMOXIFEN CITRATE 20 MG PO TABS: ORAL_TABLET | ORAL | Status: DC

## 2016-01-11 NOTE — Telephone Encounter (Signed)
Appt made and avs printed °

## 2016-01-11 NOTE — Progress Notes (Signed)
ID: Melinda Hudson   DOB: 1934-06-13  MR#: [id number]  WER#:[id number]  PCP:  Raelene Bott, MD GYN: SURG:  Erroll Luna, MD OTHER:  Abbe Amsterdam MD  CHIEF COMPLAINT:  Left Breast Cancer  CURRENT TREATMENT: Tamoxifen   HISTORY OF PRESENT ILLNESS: From the original intake note:  The patient had mammography in [facility], which showed some architectural distortion in dense breasts.  She had an ultrasound there as well, but I do not have that report.  She was referred [facility] for [facility], and on 07/06 Dr. Sadie Haber performed left breast ultrasound, which showed an ill defined solid mass at 12 o'clock, 2 cm from the left nipple meaasuring 2.8 cm.  Ultrasound of the left axilla showed a lymph node with thickened cortex suspicious for metastatic deposit.  Both the breast mass and the lymph node were biopsied that day, and the pathology [id number]) showed the mass to be an invasive ductal carcinoma with abundant mucin, low grade, strongly estrogen and progesterone receptor positive, HER-2 negative with a low proliferation fraction at 5%.  The lymph node biopsy was negative.  With this information the patient was referred to Dr. Brantley Stage, and bilateral breast MRIs were obtained on 07/10.  This showed a 3.6 cm lobulated enhancing mass in the central left breast with no additional masses.  The mildly enlarged left axillary lymph node previously biopsied was again seen by MRI.  It measured 2.1 cm.  There were no other abnormal appearing lymph nodes.  There was a small cyst incidentally noted in the left lobe of the liver.  The patient has refused surgery. Her subsequent history is as detailed below.  INTERVAL HISTORY: Melinda Hudson returns today for follow-up of her estrogen receptor positive breast cancer accompanied by her sister. She really continues on tamoxifen. She does have some hot flashes associated with this, but no vaginal wetness. She obtains a drug at a very good  price.  Since her last visit here she turned 82. Her family gave her a birthday at [employer] in [place] which she greatly enjoyed.  REVIEW OF SYSTEMS: Melinda Hudson continues to be very active. She has some arthritis pains which are minimal. A detailed review of systems today was otherwise noncontributory  PAST MEDICAL HISTORY: Past Medical History  Diagnosis Date  . Cancer   . Breast cancer   Right knee arthroscopy remotely under Dr. Noemi Chapel, history of hypertension, history of seasonal allergies, status post TAH/BSO with a prior history of fibroids, status post tonsillectomy and adenoidectomy, status post lumbar laminectomy under [name], status post bilateral cataract surgery, and history of hypercholesterolemia.  FAMILY HISTORY The patient's father died from a myocardial infarction at age 81.  The patient's mother died with heart problems at age 80.  The patient is one of 12 siblings, 3 sisters and 9 brothers.  There is no history of breast or ovarian cancer in the immediate family, although 2 of the patient's mother's sisters out of 10 had breast cancer late in life.  GYNECOLOGIC HISTORY: The patient is GX, P0.  She took hormone replacement after her hysterectomy until about 2005.  SOCIAL HISTORY:  (Updated December 2014) She used to live in [place], and worked for [employer].  When she retired in 2001, she moved back to [place].  She is a widow, and lives in [facility], which is a home for senior citizens.  She has an independent apartment, but if she needed nursing home services, it would be available through  the same facility.  Her sister, [name], is present today.  She also lives in [place].  She is a former Engineer, mining.  In case of any problems, [place] would like [place] to contact Delois, and Delois's home number is [phone], and her cell number is [phone].   ADVANCED DIRECTIVES: in place  HEALTH  MAINTENANCE: (Updated December 2014)  Social History  Substance Use Topics  . Smoking status: Never Smoker   . Smokeless tobacco: Not on file  . Alcohol Use: No     Colonoscopy: 2007/[facility]  PAP: s/p hysterectomy  Bone density: May 2010/ normal  Lipid panel: "good", Dr. Charlett Blake  Allergies  Allergen Reactions  . Sulfa Antibiotics Rash    Current Outpatient Prescriptions  Medication Sig Dispense Refill  . Calcium Carbonate-Vitamin D (CALTRATE 600+D) 600-400 MG-UNIT per tablet Take 1 tablet by mouth daily.      Marland Kitchen FLUARIX injection     . fluticasone (FLONASE) 50 MCG/ACT nasal spray Place 2 sprays into the nose daily. allergies    . furosemide (LASIX) 40 MG tablet Take 40 mg by mouth daily.      Marland Kitchen lisinopril-hydrochlorothiazide (PRINZIDE,ZESTORETIC) 20-25 MG per tablet Take 1 tablet by mouth daily.      Marland Kitchen lovastatin (MEVACOR) 40 MG tablet Take 40 mg by mouth at bedtime.      . Multiple Vitamins-Minerals (CENTRUM SILVER PO) Take 1 tablet by mouth daily.      Marland Kitchen omeprazole (PRILOSEC) 20 MG capsule     . potassium chloride SA (K-DUR,KLOR-CON) 20 MEQ tablet Take 20 mEq by mouth daily.      . sucralfate (CARAFATE) 1 G tablet     . tamoxifen (NOLVADEX) 20 MG tablet Take 1 tablet by mouth  daily 90 tablet 4   No current facility-administered medications for this visit.    OBJECTIVE: Middle-aged [place] American woman Who appears well Filed Vitals:   01/11/16 1358  BP: 144/63  Pulse: 70  Temp: 98.1 F (36.7 C)  Resp: 18     Body mass index is 31.15 kg/(m^2).    ECOG FS: 0 Filed Weights   01/11/16 1358  Weight: 164 lb 12.8 oz (74.753 kg)    Sclerae unicteric, EOMs intact Oropharynx clear and moist No cervical or supraclavicular adenopathy Lungs no rales or rhonchi Heart regular rate and rhythm Abd soft, nontender, positive bowel sounds MSK no focal spinal tenderness, no upper extremity lymphedema Neuro: nonfocal, well oriented, appropriate affect Breasts: The right breast  is unremarkable. I do not palpate a mass in the left breast. There is no skin or nipple changes of concern. Left axilla is benign.  LAB RESULTS: Lab Results  Component Value Date   WBC 5.7 01/11/2016   NEUTROABS 3.8 01/11/2016   HGB 11.8 01/11/2016   HCT 36.9 01/11/2016   MCV 91.7 01/11/2016   PLT 193 01/11/2016      Chemistry      Component Value Date/Time   NA 141 01/11/2016 1303   NA 138 11/01/2011 1321   K 3.7 01/11/2016 1303   K 3.6 11/01/2011 1321   CL 104 10/21/2012 1113   CL 103 11/01/2011 1321   CO2 28 01/11/2016 1303   CO2 26 11/01/2011 1321   BUN 24.1 01/11/2016 1303   BUN 13 11/01/2011 1321   CREATININE 0.9 01/11/2016 1303   CREATININE 0.96 11/01/2011 1321      Component Value Date/Time   CALCIUM 9.3 01/11/2016 1303   CALCIUM 9.3 11/01/2011 1321  ALKPHOS 57 01/11/2016 1303   ALKPHOS 40 11/01/2011 1321   AST 23 01/11/2016 1303   AST 25 11/01/2011 1321   ALT 20 01/11/2016 1303   ALT 22 11/01/2011 1321   BILITOT 0.34 01/11/2016 1303   BILITOT 0.4 11/01/2011 1321       STUDIES: [facility] 01/03/2016 on the breast density to be category C. The ill-defined focal asymmetry in the left breast containing a tissue post biopsy marker was ill-defined and stable. There is no focal mass.   ASSESSMENT: 81 y.o.  [facility] woman status post left breast biopsy in July 2011 for clinical T2 N0, Stage IIA invasive ductal carcinoma, grade 1, strongly estrogen and receptor positive, both at 100%, HER2/neu negative, with low MIB-1 of 5%. A left axillary lymph node biopsied at the time of initial diagnosis was negative.   (1) Declined surgery.   (2) On neoadjuvant letrozole between July 2011 and October 2012. MRI at that time showed some evidence of tumor growth, letrozole was discontinued   (3) started on tamoxifen in early October 2012   PLAN:  Melinda Hudson Is doing terrific now 6 years out from her diagnosis of breast cancer with no evidence of disease  progression.  In fact her mammography now no longer shows a mass but an area of irregularity only.  Since she really does not want surgery the plan is to continue tamoxifen indefinitely so long as there is no evidence of disease progression.  She will return to see me in one year. She will have repeat mammography before that visit.  She knows to call for any problems that may develop before then Chauncey Cruel, MD      01/11/2016

## 2017-01-08 ENCOUNTER — Other Ambulatory Visit: Payer: Self-pay

## 2017-01-08 DIAGNOSIS — C50912 Malignant neoplasm of unspecified site of left female breast: Secondary | ICD-10-CM

## 2017-01-09 ENCOUNTER — Ambulatory Visit (HOSPITAL_BASED_OUTPATIENT_CLINIC_OR_DEPARTMENT_OTHER): Payer: Medicare Other | Admitting: Oncology

## 2017-01-09 ENCOUNTER — Other Ambulatory Visit (HOSPITAL_BASED_OUTPATIENT_CLINIC_OR_DEPARTMENT_OTHER): Payer: Medicare Other

## 2017-01-09 DIAGNOSIS — Z170 Estrogen receptor positive status [ER+]: Secondary | ICD-10-CM

## 2017-01-09 DIAGNOSIS — C50912 Malignant neoplasm of unspecified site of left female breast: Secondary | ICD-10-CM

## 2017-01-09 DIAGNOSIS — N898 Other specified noninflammatory disorders of vagina: Secondary | ICD-10-CM | POA: Diagnosis not present

## 2017-01-09 DIAGNOSIS — C50812 Malignant neoplasm of overlapping sites of left female breast: Secondary | ICD-10-CM

## 2017-01-09 LAB — CBC WITH DIFFERENTIAL/PLATELET
BASO%: 0.5 % (ref 0.0–2.0)
Basophils Absolute: 0 10*3/uL (ref 0.0–0.1)
EOS%: 0.9 % (ref 0.0–7.0)
Eosinophils Absolute: 0.1 10*3/uL (ref 0.0–0.5)
HCT: 35.9 % (ref 34.8–46.6)
HGB: 11.9 g/dL (ref 11.6–15.9)
LYMPH%: 27.9 % (ref 14.0–49.7)
MCH: 30.1 pg (ref 25.1–34.0)
MCHC: 33.1 g/dL (ref 31.5–36.0)
MCV: 91.1 fL (ref 79.5–101.0)
MONO#: 0.5 10*3/uL (ref 0.1–0.9)
MONO%: 9.4 % (ref 0.0–14.0)
NEUT%: 61.3 % (ref 38.4–76.8)
NEUTROS ABS: 3.4 10*3/uL (ref 1.5–6.5)
PLATELETS: 214 10*3/uL (ref 145–400)
RBC: 3.95 10*6/uL (ref 3.70–5.45)
RDW: 13.8 % (ref 11.2–14.5)
WBC: 5.6 10*3/uL (ref 3.9–10.3)
lymph#: 1.6 10*3/uL (ref 0.9–3.3)

## 2017-01-09 LAB — COMPREHENSIVE METABOLIC PANEL
ALT: 17 U/L (ref 0–55)
ANION GAP: 10 meq/L (ref 3–11)
AST: 22 U/L (ref 5–34)
Albumin: 4 g/dL (ref 3.5–5.0)
Alkaline Phosphatase: 56 U/L (ref 40–150)
BUN: 16.4 mg/dL (ref 7.0–26.0)
CHLORIDE: 104 meq/L (ref 98–109)
CO2: 27 meq/L (ref 22–29)
CREATININE: 0.9 mg/dL (ref 0.6–1.1)
Calcium: 9.4 mg/dL (ref 8.4–10.4)
EGFR: 73 mL/min/{1.73_m2} — AB (ref >90)
GLUCOSE: 86 mg/dL (ref 70–140)
Potassium: 4.1 mEq/L (ref 3.5–5.1)
SODIUM: 141 meq/L (ref 136–145)
TOTAL PROTEIN: 6.9 g/dL (ref 6.4–8.3)
Total Bilirubin: 0.36 mg/dL (ref 0.20–1.20)

## 2017-01-09 MED ORDER — TAMOXIFEN CITRATE 20 MG PO TABS: ORAL_TABLET | ORAL | 4 refills | Status: DC

## 2017-01-09 NOTE — Progress Notes (Signed)
ID: Melinda Hudson   DOB: 01/28/1934  MR#: [id number]  ZCH#:[id number]  PCP:  Raelene Bott, MD GYN: SURG:  Erroll Luna, MD OTHER:  Abbe Amsterdam MD  CHIEF COMPLAINT:  Left Breast Cancer  CURRENT TREATMENT: Tamoxifen   HISTORY OF PRESENT ILLNESS: From the original intake note:  The patient had mammography in [facility], which showed some architectural distortion in dense breasts.  She had an ultrasound there as well, but I do not have that report.  [facility] was [facility] for [facility], and on 07/06 Dr. Sadie Haber performed left breast ultrasound, which showed an ill defined solid mass at 12 o'clock, 2 cm from the left nipple meaasuring 2.8 cm.  Ultrasound of the left axilla showed a lymph node with thickened cortex suspicious for metastatic deposit.  Both the breast mass and the lymph node were biopsied that day, and the pathology [id number]) showed the mass to be an invasive ductal carcinoma with abundant mucin, low grade, strongly estrogen and progesterone receptor positive, HER-2 negative with a low proliferation fraction at 5%.  The lymph node biopsy was negative.  With this information the patient was referred to Dr. Brantley Stage, and bilateral breast MRIs were obtained on 07/10.  This showed a 3.6 cm lobulated enhancing mass in the central left breast with no additional masses.  The mildly enlarged left axillary lymph node previously biopsied was again seen by MRI.  It measured 2.1 cm.  There were no other abnormal appearing lymph nodes.  There was a small cyst incidentally noted in the left lobe of the liver.  The patient has refused surgery. Her subsequent history is as detailed below.  INTERVAL HISTORY: Parissa returns today for follow-up and treatment of her estrogen receptor positive breast cancer. She continues on tamoxifen, with good tolerance. Her hot flashes are pretty much gone. She still does have a vaginal discharge and she uses a pad for that. She  has no other problems from this medication which she obtains at a very good price.  REVIEW OF SYSTEMS: Selinda Flavin has gone back to the gym and uses it at least twice a week. She does the treadmill and a stationary bike. Occasionally she does some weights. She does have arthritis problems and the exercise seems to help. Aside from this issues a detailed review of systems today was benign.  PAST MEDICAL HISTORY: Past Medical History:  Diagnosis Date  . Breast cancer   . Cancer   Right knee arthroscopy remotely under Dr. Noemi Chapel, history of hypertension, history of seasonal allergies, status post TAH/BSO with a prior history of fibroids, status post tonsillectomy and adenoidectomy, status post lumbar laminectomy under [name], status post bilateral cataract surgery, and history of hypercholesterolemia.  FAMILY HISTORY The patient's father died from a myocardial infarction at age 78.  The patient's mother died with heart problems at age 77.  The patient is one of 12 siblings, 3 sisters and 9 brothers.  There is no history of breast or ovarian cancer in the immediate family, although 2 of the patient's mother's sisters out of 76 had breast cancer late in life.  GYNECOLOGIC HISTORY: The patient is GX, P0.  She took hormone replacement after her hysterectomy until about 2005.  SOCIAL HISTORY:  (Updated December 2014) She used to live in [place], and worked for [employer].  When she retired in 2001, she moved back to [place].  She is a widow, and lives in [facility], which is a home for senior  citizens.  She has an independent apartment, but if she needed nursing home services, it would be available through the same facility.  Her sister, [name], is present today.  She also lives in [place].  She is a former Engineer, mining.  In case of any problems, Breleigh would like [place] to contact Delois, and Delois's home number is [phone], and her cell number  is [phone].   ADVANCED DIRECTIVES: in place  HEALTH MAINTENANCE: (Updated December 2014)  Social History  Substance Use Topics  . Smoking status: Never Smoker  . Smokeless tobacco: Not on file  . Alcohol use No     Colonoscopy: 2007/[facility]  PAP: s/p hysterectomy  Bone density: May 2010/ normal  Lipid panel: "good", Dr. Charlett Blake  Allergies  Allergen Reactions  . Sulfa Antibiotics Rash    Current Outpatient Prescriptions  Medication Sig Dispense Refill  . Calcium Carbonate-Vitamin D (CALTRATE 600+D) 600-400 MG-UNIT per tablet Take 1 tablet by mouth daily.      . fluticasone (FLONASE) 50 MCG/ACT nasal spray Place 2 sprays into the nose daily. allergies    . furosemide (LASIX) 40 MG tablet Take 40 mg by mouth daily.      Marland Kitchen lisinopril-hydrochlorothiazide (PRINZIDE,ZESTORETIC) 20-25 MG per tablet Take 1 tablet by mouth daily.      Marland Kitchen lovastatin (MEVACOR) 40 MG tablet Take 40 mg by mouth at bedtime.      . Multiple Vitamins-Minerals (CENTRUM SILVER PO) Take 1 tablet by mouth daily.      Marland Kitchen omeprazole (PRILOSEC) 20 MG capsule     . potassium chloride SA (K-DUR,KLOR-CON) 20 MEQ tablet Take 20 mEq by mouth daily.      . sucralfate (CARAFATE) 1 G tablet     . tamoxifen (NOLVADEX) 20 MG tablet Take 1 tablet by mouth  daily 90 tablet 4   No current facility-administered medications for this visit.     OBJECTIVE: Older African American woman in no acute distress  Vitals:   01/09/17 1451  BP: (!) 151/78  Pulse: 79  Resp: 20  Temp: 97.8 F (36.6 C)     Body mass index is 30.84 kg/m.    ECOG FS: 1 Filed Weights   01/09/17 1451  Weight: 163 lb 3.2 oz (74 kg)    Sclerae unicteric, pupils round and equal Oropharynx clear and moist No cervical or supraclavicular adenopathy Lungs no rales or rhonchi Heart regular rate and rhythm Abd soft, nontender, positive bowel sounds MSK no focal spinal tenderness, no upper extremity lymphedema Neuro: nonfocal, well oriented, appropriate  affect Breasts: I do not palpate a mass in either breast. There are no skin or nipple changes of concern. Both axillae are benign.   LAB RESULTS: Lab Results  Component Value Date   WBC 5.6 01/09/2017   NEUTROABS 3.4 01/09/2017   HGB 11.9 01/09/2017   HCT 35.9 01/09/2017   MCV 91.1 01/09/2017   PLT 214 01/09/2017      Chemistry      Component Value Date/Time   NA 141 01/09/2017 1406   K 4.1 01/09/2017 1406   CL 104 10/21/2012 1113   CO2 27 01/09/2017 1406   BUN 16.4 01/09/2017 1406   CREATININE 0.9 01/09/2017 1406      Component Value Date/Time   CALCIUM 9.4 01/09/2017 1406   ALKPHOS 56 01/09/2017 1406   AST 22 01/09/2017 1406   ALT 17 01/09/2017 1406   BILITOT 0.36 01/09/2017 1406       STUDIES:  [facility] 01/03/2016 on the breast density to be category C. The ill-defined focal asymmetry in the left breast containing a tissue post biopsy marker was ill-defined and stable. There is no focal mass. Mammography this year is a bit behind.  ASSESSMENT: 83 y.o.  [facility] woman status post left breast biopsy in July 2011 for clinical T2 N0, Stage IIA invasive ductal carcinoma, grade 1, strongly estrogen and receptor positive, both at 100%, HER2/neu negative, with low MIB-1 of 5%. A left axillary lymph node biopsied at the time of initial diagnosis was negative.   (1) Declined surgery.   (2) On neoadjuvant letrozole between July 2011 and October 2012. MRI at that time showed some evidence of tumor growth, letrozole was discontinued   (3) started on tamoxifen in early October 2012   PLAN:  Felita Is now 7 years out from definitive diagnoses of her estrogen receptor positive breast cancer. There has been no evidence of disease progression.  She has refused surgery and is being followed with anti-estrogens indefinitely.  She is behind on her mammography. I have placed the order for her to have that within the next week, but I have gone ahead and asked  her to call to make sure this happens.  Since it will be a year before she has her next mammogram, I am move her appointment with me 10/12/2017 to August, which means she will have had her mammogram 2019 already before that visit.  I am delighted that her going back to the gym and encouraged her to continue to do that.  Otherwise she knows to call for any problems that may develop before her next visit. Chauncey Cruel, MD      01/09/2017

## 2017-01-10 ENCOUNTER — Other Ambulatory Visit: Payer: Self-pay

## 2017-01-10 MED ORDER — TAMOXIFEN CITRATE 20 MG PO TABS: ORAL_TABLET | ORAL | 4 refills | Status: DC

## 2017-01-28 ENCOUNTER — Telehealth: Payer: Self-pay

## 2017-01-28 NOTE — Telephone Encounter (Signed)
Per pt request, order for mammogram faxed to [facility].

## 2017-01-29 ENCOUNTER — Other Ambulatory Visit: Payer: Self-pay

## 2017-01-29 ENCOUNTER — Telehealth: Payer: Self-pay

## 2017-01-29 DIAGNOSIS — Z170 Estrogen receptor positive status [ER+]: Principal | ICD-10-CM

## 2017-01-29 DIAGNOSIS — C50812 Malignant neoplasm of overlapping sites of left female breast: Secondary | ICD-10-CM

## 2017-01-29 NOTE — Telephone Encounter (Signed)
Received fax from [facility] stating they do not perform 3D (tomo) mammogram. Pt made aware and also explained to pt that tomo would be best for her. Pt agrees to have mammogram done at [facility] here in [place]. New order has been sent to [facility].

## 2017-03-13 ENCOUNTER — Ambulatory Visit
Admission: RE | Admit: 2017-03-13 | Discharge: 2017-03-13 | Disposition: A | Discharge status: Home / Self Care | Payer: Medicare Other | Source: Ambulatory Visit | Attending: Oncology | Admitting: Oncology

## 2017-03-13 DIAGNOSIS — C50812 Malignant neoplasm of overlapping sites of left female breast: Secondary | ICD-10-CM

## 2017-03-13 DIAGNOSIS — Z170 Estrogen receptor positive status [ER+]: Principal | ICD-10-CM

## 2017-10-21 ENCOUNTER — Telehealth: Payer: Self-pay | Admitting: *Deleted

## 2017-10-21 NOTE — Telephone Encounter (Signed)
This nurse called [facility] for clarification of fax received without patient demographics.    Listed as second attempt, faxed 10-03-2017 and 10-18-2017 to be filled in ASAP.  "Request is to find out how much this patient may have to pay out of pocket for the next 12 months.  If unknown, the amount she has paid out of pocket in the past twelve months to estimate the next twelve for medical expenses."    Expressed [facility] billing is not managed with-in our office, request may need to be directed/sent out to group that does therefore creating a wait time.    Facsimile filed in Therapist, music for pick-up

## 2017-12-28 ENCOUNTER — Other Ambulatory Visit: Payer: Self-pay | Admitting: Oncology

## 2018-02-03 NOTE — Progress Notes (Signed)
ID: Melinda Hudson   DOB: 06/19/1934  MR#: [id number]  QIO#:[id number]  PCP:  Raelene Bott, MD GYN: SURG:  Erroll Luna, MD OTHER:  Abbe Amsterdam MD  CHIEF COMPLAINT:  Left Breast Cancer  CURRENT TREATMENT: Tamoxifen   HISTORY OF PRESENT ILLNESS: From the original intake note:  The patient had mammography in [facility], which showed some architectural distortion in dense breasts.  She had an ultrasound there as well, but I do not have that report.  She was [facility] for [facility] evaluation, and on 07/06 Dr. Sadie Haber performed left breast ultrasound, which showed an ill defined solid mass at 12 o'clock, 2 cm from the left nipple meaasuring 2.8 cm.  Ultrasound of the left axilla showed a lymph node with thickened cortex suspicious for metastatic deposit.  Both the breast mass and the lymph node were biopsied that day, and the pathology [id number]) showed the mass to be an invasive ductal carcinoma with abundant mucin, low grade, strongly estrogen and progesterone receptor positive, HER-2 negative with a low proliferation fraction at 5%.  The lymph node biopsy was negative.  With this information the patient was referred to Dr. Brantley Stage, and bilateral breast MRIs were obtained on 07/10.  This showed a 3.6 cm lobulated enhancing mass in the central left breast with no additional masses.  The mildly enlarged left axillary lymph node previously biopsied was again seen by MRI.  It measured 2.1 cm.  There were no other abnormal appearing lymph nodes.  There was a small cyst incidentally noted in the left lobe of the liver.  The patient has refused surgery. Her subsequent history is as detailed below.  INTERVAL HISTORY: Melinda Hudson returns today for follow-up and treatment of her estrogen receptor positive breast cancer. She continues on tamoxifen, with god tolerance. She has increased vaginal discharge and she aids this with pads. She also has occasional hot flashes, but they  aren't intense.    REVIEW OF SYSTEMS: Melinda Hudson reports that she has been doing chores and taking it easy lately. She notes that her brother-in-law along with some friends and church members have passed away over the year. She denies unusual headaches, visual changes, nausea, vomiting, or dizziness. There has been no unusual cough, phlegm production, or pleurisy. There has been no change in bowel or bladder habits. She denies unexplained fatigue or unexplained weight loss, bleeding, rash, or fever. A detailed review of systems was otherwise stable.    PAST MEDICAL HISTORY: Past Medical History:  Diagnosis Date  . Breast cancer   . Cancer   Right knee arthroscopy remotely under Dr. Noemi Chapel, history of hypertension, history of seasonal allergies, status post TAH/BSO with a prior history of fibroids, status post tonsillectomy and adenoidectomy, status post lumbar laminectomy under [name], status post bilateral cataract surgery, and history of hypercholesterolemia.  FAMILY HISTORY The patient's father died from a myocardial infarction at age 60.  The patient's mother died with heart problems at age 17.  The patient is one of 12 siblings, 3 sisters and 9 brothers.  There is no history of breast or ovarian cancer in the immediate family, although 2 of the patient's mother's sisters out of 23 had breast cancer late in life.  GYNECOLOGIC HISTORY: The patient is GX, P0.  She took hormone replacement after her hysterectomy until about 2005.  SOCIAL HISTORY:  (Updated December 2014) She used to live in [place], and worked for [employer].  When she retired in 2001, she moved back to  [place].  She is a widow, and lives in [facility], which is a home for senior citizens.  She has an independent apartment, but if she needed nursing home services, it would be available through the same facility.  Her sister, Melinda Hudson, is present today.  She also lives in [place].  She is a former  Engineer, mining.  In case of any problems, Melinda Hudson would like [place] to contact Melinda Hudson, and Melinda Hudson's home number is [phone], and her cell number is [phone].   ADVANCED DIRECTIVES: in place  HEALTH MAINTENANCE: (Updated December 2014)  Social History   Tobacco Use  . Smoking status: Never Smoker  Substance Use Topics  . Alcohol use: No  . Drug use: No     Colonoscopy: 2007/[facility]  PAP: s/p hysterectomy  Bone density: May 2010/ normal  Lipid panel: "good", Dr. Charlett Blake  Allergies  Allergen Reactions  . Sulfa Antibiotics Rash    Current Outpatient Medications  Medication Sig Dispense Refill  . Calcium Carbonate-Vitamin D (CALTRATE 600+D) 600-400 MG-UNIT per tablet Take 1 tablet by mouth daily.      . fluticasone (FLONASE) 50 MCG/ACT nasal spray Place 2 sprays into the nose daily. allergies    . furosemide (LASIX) 40 MG tablet Take 40 mg by mouth daily.      Marland Kitchen lisinopril-hydrochlorothiazide (PRINZIDE,ZESTORETIC) 20-25 MG per tablet Take 1 tablet by mouth daily.      Marland Kitchen lovastatin (MEVACOR) 40 MG tablet Take 40 mg by mouth at bedtime.      . Multiple Vitamins-Minerals (CENTRUM SILVER PO) Take 1 tablet by mouth daily.      Marland Kitchen omeprazole (PRILOSEC) 20 MG capsule     . potassium chloride SA (K-DUR,KLOR-CON) 20 MEQ tablet Take 20 mEq by mouth daily.      . sucralfate (CARAFATE) 1 G tablet     . tamoxifen (NOLVADEX) 20 MG tablet TAKE 1 TABLET BY MOUTH  DAILY 90 tablet 4   No current facility-administered medications for this visit.     OBJECTIVE: Older African American woman appears well  Vitals:   02/04/18 1414  BP: 116/64  Pulse: 77  Resp: 18  Temp: 98.4 F (36.9 C)  SpO2: 98%     Body mass index is 30.82 kg/m.    ECOG FS: 1 Filed Weights   02/04/18 1414  Weight: 163 lb 1.6 oz (74 kg)    Sclerae unicteric, EOMs intact No cervical or supraclavicular adenopathy Lungs no rales or rhonchi Heart regular rate and rhythm Abd soft, nontender,  positive bowel sounds MSK no focal spinal tenderness, no upper extremity lymphedema Neuro: nonfocal, well oriented, appropriate affect Breasts: There are no breast masses palpable bilaterally, no skin or nipple changes bilaterally.  Both axillae are benign.  LAB RESULTS: Lab Results  Component Value Date   WBC 5.9 02/04/2018   NEUTROABS 3.2 02/04/2018   HGB 11.4 (L) 02/04/2018   HCT 34.4 (L) 02/04/2018   MCV 92.2 02/04/2018   PLT 198 02/04/2018      Chemistry      Component Value Date/Time   NA 141 01/09/2017 1406   K 4.1 01/09/2017 1406   CL 104 10/21/2012 1113   CO2 27 01/09/2017 1406   BUN 16.4 01/09/2017 1406   CREATININE 0.9 01/09/2017 1406      Component Value Date/Time   CALCIUM 9.4 01/09/2017 1406   ALKPHOS 56 01/09/2017 1406   AST 22 01/09/2017 1406   ALT 17 01/09/2017 1406  BILITOT 0.36 01/09/2017 1406       STUDIES: B[facility]2017 showed breast density category C. There was subtle distortion at the site of known breast cancer. There was no evidence of malignancy in the right breast.   She will be due for repeat mammography in September 2019.   ASSESSMENT: 84 y.o.  [facility] woman status post left breast biopsy in July 2011 for clinical T2 N0, Stage IIA invasive ductal carcinoma, grade 1, strongly estrogen and receptor positive, both at 100%, HER2/neu negative, with low MIB-1 of 5%. A left axillary lymph node biopsied at the time of initial diagnosis was negative.   (1) Declined surgery.   (2) On neoadjuvant letrozole between July 2011 and October 2012. MRI at that time showed some evidence of tumor growth, letrozole was discontinued   (3) started on tamoxifen in early October 2012   PLAN:  Melinda Hudson is now 8 years out from initial diagnosis of her breast cancer.  She decided against surgery and is being treated with antiestrogens alone.  So far there has not been evidence of disease progression.  She  tolerates tamoxifen well and the plan is to continue that indefinitely or until there is evidence of disease progression  She was supposed to have had her mammogram in August.  She will have it in September instead and that means next year she will again have it in September so I will see her October of next year  She does have very minimal anemia.  There is no change in her MCV so I do not think she is going to be iron deficient.  I told her for now we would simply keep an eye on this  She will call with any other issues that may develop before her next visit here.  Melinda Hudson, Melinda Dad, MD  02/04/18 2:49 PM Medical Oncology and Hematology [facility] [address] [place], [place] [address] Tel. [phone]    Fax. [phone]  Alice Rieger, am acting as scribe for Chauncey Cruel MD.  I, Lurline Del MD, have reviewed the above documentation for accuracy and completeness, and I agree with the above.

## 2018-02-04 ENCOUNTER — Inpatient Hospital Stay (HOSPITAL_BASED_OUTPATIENT_CLINIC_OR_DEPARTMENT_OTHER): Payer: Medicare Other | Admitting: Oncology

## 2018-02-04 ENCOUNTER — Inpatient Hospital Stay: Payer: Medicare Other | Attending: Oncology

## 2018-02-04 ENCOUNTER — Telehealth: Payer: Self-pay | Admitting: Oncology

## 2018-02-04 VITALS — BP 116/64 | HR 77 | Temp 98.4°F | Resp 18 | Ht 61.0 in | Wt 163.1 lb

## 2018-02-04 DIAGNOSIS — Z79810 Long term (current) use of selective estrogen receptor modulators (SERMs): Secondary | ICD-10-CM

## 2018-02-04 DIAGNOSIS — C50812 Malignant neoplasm of overlapping sites of left female breast: Secondary | ICD-10-CM | POA: Insufficient documentation

## 2018-02-04 DIAGNOSIS — I10 Essential (primary) hypertension: Secondary | ICD-10-CM | POA: Insufficient documentation

## 2018-02-04 DIAGNOSIS — Z170 Estrogen receptor positive status [ER+]: Secondary | ICD-10-CM

## 2018-02-04 DIAGNOSIS — D649 Anemia, unspecified: Secondary | ICD-10-CM | POA: Insufficient documentation

## 2018-02-04 LAB — CBC WITH DIFFERENTIAL/PLATELET
BASOS ABS: 0 10*3/uL (ref 0.0–0.1)
BASOS PCT: 0 %
Eosinophils Absolute: 0.1 10*3/uL (ref 0.0–0.5)
Eosinophils Relative: 2 %
HEMATOCRIT: 34.4 % — AB (ref 34.8–46.6)
HEMOGLOBIN: 11.4 g/dL — AB (ref 11.6–15.9)
LYMPHS PCT: 32 %
Lymphs Abs: 1.9 10*3/uL (ref 0.9–3.3)
MCH: 30.6 pg (ref 25.1–34.0)
MCHC: 33.1 g/dL (ref 31.5–36.0)
MCV: 92.2 fL (ref 79.5–101.0)
Monocytes Absolute: 0.7 10*3/uL (ref 0.1–0.9)
Monocytes Relative: 12 %
NEUTROS ABS: 3.2 10*3/uL (ref 1.5–6.5)
NEUTROS PCT: 54 %
Platelets: 198 10*3/uL (ref 145–400)
RBC: 3.73 MIL/uL (ref 3.70–5.45)
RDW: 13.6 % (ref 11.2–14.5)
WBC: 5.9 10*3/uL (ref 3.9–10.3)

## 2018-02-04 LAB — COMPREHENSIVE METABOLIC PANEL
ALK PHOS: 52 U/L (ref 38–126)
ALT: 20 U/L (ref 0–44)
ANION GAP: 10 (ref 5–15)
AST: 26 U/L (ref 15–41)
Albumin: 4 g/dL (ref 3.5–5.0)
BUN: 15 mg/dL (ref 8–23)
CALCIUM: 8.9 mg/dL (ref 8.9–10.3)
CO2: 25 mmol/L (ref 22–32)
Chloride: 105 mmol/L (ref 98–111)
Creatinine, Ser: 0.83 mg/dL (ref 0.44–1.00)
GFR calc non Af Amer: >60 mL/min (ref >60)
Glucose, Bld: 88 mg/dL (ref 70–99)
POTASSIUM: 4.1 mmol/L (ref 3.5–5.1)
SODIUM: 140 mmol/L (ref 135–145)
TOTAL PROTEIN: 6.7 g/dL (ref 6.5–8.1)
Total Bilirubin: 0.4 mg/dL (ref 0.3–1.2)

## 2018-02-04 NOTE — Telephone Encounter (Signed)
Gave patient avs report and appointments for October 2020. Patient will be contacted re mammo and [facility].

## 2018-03-25 ENCOUNTER — Ambulatory Visit
Admission: RE | Admit: 2018-03-25 | Discharge: 2018-03-25 | Disposition: A | Discharge status: Home / Self Care | Payer: Medicare Other | Source: Ambulatory Visit | Attending: Oncology | Admitting: Oncology

## 2018-03-25 DIAGNOSIS — Z170 Estrogen receptor positive status [ER+]: Principal | ICD-10-CM

## 2018-03-25 DIAGNOSIS — C50812 Malignant neoplasm of overlapping sites of left female breast: Secondary | ICD-10-CM

## 2019-01-06 ENCOUNTER — Other Ambulatory Visit: Payer: Self-pay | Admitting: Oncology

## 2019-03-11 ENCOUNTER — Other Ambulatory Visit: Payer: Self-pay | Admitting: Oncology

## 2019-03-11 DIAGNOSIS — Z853 Personal history of malignant neoplasm of breast: Secondary | ICD-10-CM

## 2019-03-27 ENCOUNTER — Other Ambulatory Visit: Payer: Self-pay

## 2019-03-27 ENCOUNTER — Ambulatory Visit
Admission: RE | Admit: 2019-03-27 | Discharge: 2019-03-27 | Disposition: A | Discharge status: Home / Self Care | Payer: Medicare Other | Source: Ambulatory Visit | Attending: Oncology | Admitting: Oncology

## 2019-03-27 DIAGNOSIS — Z853 Personal history of malignant neoplasm of breast: Secondary | ICD-10-CM

## 2019-03-27 HISTORY — DX: Personal history of antineoplastic chemotherapy: Z92.21

## 2019-03-31 ENCOUNTER — Inpatient Hospital Stay: Payer: Medicare Other | Attending: Oncology | Admitting: Oncology

## 2019-03-31 ENCOUNTER — Other Ambulatory Visit: Payer: Self-pay

## 2019-03-31 ENCOUNTER — Inpatient Hospital Stay: Payer: Medicare Other

## 2019-03-31 VITALS — BP 151/75 | HR 68 | Temp 98.3°F | Resp 18 | Wt 162.6 lb

## 2019-03-31 DIAGNOSIS — C773 Secondary and unspecified malignant neoplasm of axilla and upper limb lymph nodes: Secondary | ICD-10-CM | POA: Diagnosis not present

## 2019-03-31 DIAGNOSIS — Z170 Estrogen receptor positive status [ER+]: Secondary | ICD-10-CM

## 2019-03-31 DIAGNOSIS — C50912 Malignant neoplasm of unspecified site of left female breast: Secondary | ICD-10-CM | POA: Diagnosis not present

## 2019-03-31 DIAGNOSIS — I10 Essential (primary) hypertension: Secondary | ICD-10-CM | POA: Diagnosis not present

## 2019-03-31 DIAGNOSIS — C50812 Malignant neoplasm of overlapping sites of left female breast: Secondary | ICD-10-CM

## 2019-03-31 DIAGNOSIS — Z79899 Other long term (current) drug therapy: Secondary | ICD-10-CM | POA: Diagnosis not present

## 2019-03-31 DIAGNOSIS — Z79810 Long term (current) use of selective estrogen receptor modulators (SERMs): Secondary | ICD-10-CM | POA: Insufficient documentation

## 2019-03-31 LAB — CBC WITH DIFFERENTIAL/PLATELET
Abs Immature Granulocytes: 0.02 10*3/uL (ref 0.00–0.07)
Basophils Absolute: 0 10*3/uL (ref 0.0–0.1)
Basophils Relative: 0 %
Eosinophils Absolute: 0.1 10*3/uL (ref 0.0–0.5)
Eosinophils Relative: 1 %
HCT: 35.3 % — ABNORMAL LOW (ref 36.0–46.0)
Hemoglobin: 11.4 g/dL — ABNORMAL LOW (ref 12.0–15.0)
Immature Granulocytes: 0 %
Lymphocytes Relative: 27 %
Lymphs Abs: 1.6 10*3/uL (ref 0.7–4.0)
MCH: 30.1 pg (ref 26.0–34.0)
MCHC: 32.3 g/dL (ref 30.0–36.0)
MCV: 93.1 fL (ref 80.0–100.0)
Monocytes Absolute: 0.6 10*3/uL (ref 0.1–1.0)
Monocytes Relative: 10 %
Neutro Abs: 3.6 10*3/uL (ref 1.7–7.7)
Neutrophils Relative %: 62 %
Platelets: 212 10*3/uL (ref 150–400)
RBC: 3.79 MIL/uL — ABNORMAL LOW (ref 3.87–5.11)
RDW: 13.6 % (ref 11.5–15.5)
WBC: 5.8 10*3/uL (ref 4.0–10.5)
nRBC: 0 % (ref 0.0–0.2)

## 2019-03-31 LAB — COMPREHENSIVE METABOLIC PANEL
ALT: 17 U/L (ref 0–44)
AST: 25 U/L (ref 15–41)
Albumin: 4 g/dL (ref 3.5–5.0)
Alkaline Phosphatase: 47 U/L (ref 38–126)
Anion gap: 6 (ref 5–15)
BUN: 13 mg/dL (ref 8–23)
CO2: 29 mmol/L (ref 22–32)
Calcium: 8.8 mg/dL — ABNORMAL LOW (ref 8.9–10.3)
Chloride: 105 mmol/L (ref 98–111)
Creatinine, Ser: 0.78 mg/dL (ref 0.44–1.00)
GFR calc Af Amer: >60 mL/min (ref >60)
GFR calc non Af Amer: >60 mL/min (ref >60)
Glucose, Bld: 85 mg/dL (ref 70–99)
Potassium: 3.8 mmol/L (ref 3.5–5.1)
Sodium: 140 mmol/L (ref 135–145)
Total Bilirubin: 0.4 mg/dL (ref 0.3–1.2)
Total Protein: 6.6 g/dL (ref 6.5–8.1)

## 2019-03-31 MED ORDER — TAMOXIFEN CITRATE 20 MG PO TABS: 20.0000 mg | ORAL_TABLET | Freq: Every day | ORAL | 4 refills | Status: DC

## 2019-03-31 NOTE — Progress Notes (Signed)
[place]  Telephone:([phone] Fax:(336) [phone]   ID: Melinda [place]   DOB: [birth year]  MR#: [id number]  TZG#:[id number]  PCP:  Raelene Bott, MD GYN: SURG:  Erroll Luna, MD OTHER:  Abbe Amsterdam MD  CHIEF COMPLAINT:  Left Breast Cancer  CURRENT TREATMENT: Tamoxifen   HISTORY OF PRESENT ILLNESS: From the original intake note:  The patient had mammography in [facility], which showed some architectural distortion in dense breasts.  She had an ultrasound there as well, but I do not have that report.  She was referred to [facility] for further evaluation, and on 07/06 Dr. Sadie Haber performed left breast ultrasound, which showed an ill defined solid mass at 12 o'clock, 2 cm from the left nipple meaasuring 2.8 cm.  Ultrasound of the left axilla showed a lymph node with thickened cortex suspicious for metastatic deposit.  Both the breast mass and the lymph node were biopsied that day, and the pathology [id number]) showed the mass to be an invasive ductal carcinoma with abundant mucin, low grade, strongly estrogen and progesterone receptor positive, HER-2 negative with a low proliferation fraction at 5%.  The lymph node biopsy was negative.  With this information the patient was referred to Dr. Brantley Stage, and bilateral breast MRIs were obtained on 07/10.  This showed a 3.6 cm lobulated enhancing mass in the central left breast with no additional masses.  The mildly enlarged left axillary lymph node previously biopsied was again seen by MRI.  It measured 2.1 cm.  There were no other abnormal appearing lymph nodes.  There was a small cyst incidentally noted in the left lobe of the liver.  The patient has refused surgery. Her subsequent history is as detailed below.   INTERVAL HISTORY: Melinda Hudson returns today for follow-up and treatment of her left breast cancer. She was last seen here on 02/04/2018.   She continues on tamoxifen.  She tolerates this well, with  no significant hot flashes.  She does have some vaginal wetness.  She uses a pad for this.  Since her last visit here, she underwent digital diagnostic bilateral mammogram with tomography on 03/27/2019 showing: Breast Density Category C. Stable appearance of both breast. Subtle distortion in the upper-outer left breast is unchanged.   REVIEW OF SYSTEMS: Melinda Hudson tells me she fell.  She was taking her nephew to supper for his birthday, stepped over the curb, and landed on her knees and wrists.  She fortunately did not break anything but she developed significant right shoulder pain.  She was evaluated in the local emergency room and given a sling.  She has pretty much recovered from all that.  There have been no further falls.  A detailed review of systems today was otherwise stable.   PAST MEDICAL HISTORY: Past Medical History:  Diagnosis Date  . Breast cancer ([place])   . Cancer ([place])   . Personal history of chemotherapy    tamoxifen  Right knee arthroscopy remotely under Dr. Noemi Chapel, history of hypertension, history of seasonal allergies, status post TAH/BSO with a prior history of fibroids, status post tonsillectomy and adenoidectomy, status post lumbar laminectomy under [name], status post bilateral cataract surgery, and history of hypercholesterolemia.  FAMILY HISTORY The patient's father died from a myocardial infarction at age 65.  The patient's mother died with heart problems at age 80.  The patient is one of 12 siblings, 3 sisters and 9 brothers.  There is no history of breast or ovarian cancer in the immediate family,  although 2 of the patient's mother's sisters out of 4 had breast cancer late in life.  GYNECOLOGIC HISTORY: The patient is GX, P0.  She took hormone replacement after her hysterectomy until about 2005.  SOCIAL HISTORY:  (Updated December 2014) She used to live in [place], and worked for [employer].  When she retired in 2001, she moved back to [place].  She is a widow, and lives in [facility], which is a home for senior citizens.  She has an independent apartment, but if she needed nursing home services, it would be available through the same facility.  Her sister, Melinda Hudson, is present today.  She also lives in [place].  She is a former Engineer, mining.  In case of any problems, Melinda Hudson would like [place] to contact Melinda Hudson, and Melinda Hudson's home number is [phone], and her cell number is [phone].   ADVANCED DIRECTIVES: in place  HEALTH MAINTENANCE: (Updated December 2014)  Social History   Tobacco Use  . Smoking status: Never Smoker  Substance Use Topics  . Alcohol use: No  . Drug use: No     Colonoscopy: 2007/K[facility]apalli  PAP: s/p hysterectomy  Bone density: May 2010/ normal  Lipid panel: "good", Dr. Charlett Blake  Allergies  Allergen Reactions  . Sulfa Antibiotics Rash    Current Outpatient Medications  Medication Sig Dispense Refill  . Calcium Carbonate-Vitamin D (CALTRATE 600+D) 600-400 MG-UNIT per tablet Take 1 tablet by mouth daily.      . fluticasone (FLONASE) 50 MCG/ACT nasal spray Place 2 sprays into the nose daily. allergies    . furosemide (LASIX) 40 MG tablet Take 40 mg by mouth daily.      Marland Kitchen lisinopril-hydrochlorothiazide (PRINZIDE,ZESTORETIC) 20-25 MG per tablet Take 1 tablet by mouth daily.      Marland Kitchen lovastatin (MEVACOR) 40 MG tablet Take 40 mg by mouth at bedtime.      . Multiple Vitamins-Minerals (CENTRUM SILVER PO) Take 1 tablet by mouth daily.      Marland Kitchen omeprazole (PRILOSEC) 20 MG capsule     . potassium chloride SA (K-DUR,KLOR-CON) 20 MEQ tablet Take 20 mEq by mouth daily.      . sucralfate (CARAFATE) 1 G tablet     . tamoxifen (NOLVADEX) 20 MG tablet Take 1 tablet (20 mg total) by mouth daily. 90 tablet 4   No current facility-administered medications for this visit.     OBJECTIVE: Older African American woman in no acute distress  Vitals:   03/31/19 1400  BP: (!) 151/75   Pulse: 68  Resp: 18  Temp: 98.3 F (36.8 C)  SpO2: 100%   Wt Readings from Last 3 Encounters:  03/31/19 162 lb 9.6 oz (73.8 kg)  02/04/18 163 lb 1.6 oz (74 kg)  01/09/17 163 lb 3.2 oz (74 kg)   Body mass index is 30.72 kg/m.    ECOG FS:1 - Symptomatic but completely ambulatory  Ocular: Sclerae unicteric, pupils round and equal Ear-nose-throat: Wearing a mask Lymphatic: No cervical or supraclavicular adenopathy Lungs no rales or rhonchi Heart regular rate and rhythm Abd soft, nontender, positive bowel sounds MSK no focal spinal tenderness, no right upper extremity edema, good range of motion Neuro: non-focal, well-oriented, appropriate affect Breasts: I do not palpate a mass in either breast.  Both axillae are benign.  LAB RESULTS: Lab Results  Component Value Date   WBC 5.8 03/31/2019   NEUTROABS 3.6 03/31/2019   HGB 11.4 (L) 03/31/2019   HCT 35.3 (L) 03/31/2019  MCV 93.1 03/31/2019   PLT 212 03/31/2019      Chemistry      Component Value Date/Time   NA 140 03/31/2019 1351   NA 141 01/09/2017 1406   K 3.8 03/31/2019 1351   K 4.1 01/09/2017 1406   CL 105 03/31/2019 1351   CL 104 10/21/2012 1113   CO2 29 03/31/2019 1351   CO2 27 01/09/2017 1406   BUN 13 03/31/2019 1351   BUN 16.4 01/09/2017 1406   CREATININE 0.78 03/31/2019 1351   CREATININE 0.9 01/09/2017 1406      Component Value Date/Time   CALCIUM 8.8 (L) 03/31/2019 1351   CALCIUM 9.4 01/09/2017 1406   ALKPHOS 47 03/31/2019 1351   ALKPHOS 56 01/09/2017 1406   AST 25 03/31/2019 1351   AST 22 01/09/2017 1406   ALT 17 03/31/2019 1351   ALT 17 01/09/2017 1406   BILITOT 0.4 03/31/2019 1351   BILITOT 0.36 01/09/2017 1406       STUDIES: Mm Diag Breast Tomo Bilateral  Result Date: 03/27/2019 CLINICAL DATA:  85 year old with diagnosis left breast cancer in 2011. The patient is currently taking tamoxifen. She has opted to not have surgery. EXAM: DIGITAL DIAGNOSTIC BILATERAL MAMMOGRAM WITH CAD AND TOMO  COMPARISON:  Previous exam(s). ACR Breast Density Category c: The breast tissue is heterogeneously dense, which may obscure small masses. FINDINGS: Parenchymal pattern is stable, bilaterally. The spiral shaped clip in the upper-outer quadrant of the left breast associated with subtle distortion is unchanged compared to the prior exam. Mammographic images were processed with CAD. IMPRESSION: Stable appearance of both breast. Subtle distortion in the upper-outer left breast is unchanged. RECOMMENDATION: Diagnostic mammogram in 1 year is recommended. I have discussed the findings and recommendations with the patient. If applicable, a reminder letter will be sent to the patient regarding the next appointment. BI-RADS CATEGORY  6: Known biopsy-proven malignancy. Electronically Signed   By: Lillia Mountain M.D.   On: 03/27/2019 17:02     ASSESSMENT: 85 y.o.  [facility] woman status post left breast biopsy in July 2011 for clinical T2 N0, Stage IIA invasive ductal carcinoma, grade 1, strongly estrogen and receptor positive, both at 100%, HER2/neu negative, with low MIB-1 of 5%. A left axillary lymph node biopsied at the time of initial diagnosis was negative.   (1) Declined surgery.   (2) On neoadjuvant letrozole between July 2011 and October 2012. MRI at that time showed some evidence of tumor growth, letrozole was discontinued   (3) started on tamoxifen in early October 2012   PLAN:  [place] is now 9 years out from initial diagnosis of her breast cancer.  She decided against surgery so she is being treated with antiestrogens indefinitely.  The most recent mammogram shows only a slight distortion at the area of concern and this is unchanged from prior.  She is tolerating tamoxifen well.  I have refilled her prescription for the next year.  We discussed pain management because of her brother's recent surgery.  I suggested that perhaps Aleve 240 mg with Tylenol 500 mg may be more effective than what he is  currently taking.  We talked about fall prevention strategies and of course she is also being careful regarding the pandemic.  She will see me again in 1 year.  She knows to call for any other issues that may develop before then.   Chela Sutphen, Virgie Dad, MD  03/31/19 2:36 PM Medical Oncology and Hematology [facility]  [place], [place] [address] Tel. [phone]    Fax. [phone]  I, Jacqualyn Posey am acting as a Education administrator for Chauncey Cruel, MD.   I, Lurline Del MD, have reviewed the above documentation for accuracy and completeness, and I agree with the above.

## 2019-04-02 ENCOUNTER — Telehealth: Payer: Self-pay | Admitting: Oncology

## 2019-04-02 NOTE — Telephone Encounter (Signed)
I left a message regarding schedule I will mail schedule

## 2020-03-07 ENCOUNTER — Other Ambulatory Visit: Payer: Self-pay | Admitting: Oncology

## 2020-03-07 DIAGNOSIS — C50912 Malignant neoplasm of unspecified site of left female breast: Secondary | ICD-10-CM

## 2020-03-30 ENCOUNTER — Encounter: Payer: Self-pay | Admitting: Oncology

## 2020-03-30 ENCOUNTER — Other Ambulatory Visit: Payer: Self-pay

## 2020-03-30 DIAGNOSIS — Z170 Estrogen receptor positive status [ER+]: Secondary | ICD-10-CM

## 2020-03-30 NOTE — Progress Notes (Signed)
[place]  Telephone:(336) [phone] Fax:(336) [phone]   ID: Melinda [facility]   DOB: 1933/12/15  MR#: [id number]  ZTI#:[id number]  [facility] Care Team: Melinda Bott, MD as PCP - Melinda (Internal Medicine) OTHER:  Melinda Amsterdam MD  CHIEF COMPLAINT:  Left Breast Cancer  CURRENT TREATMENT: Switching from tamoxifen to anastrozole   INTERVAL HISTORY: Melinda Hudson returns today for follow-up of her left breast cancer.  She continues on tamoxifen.  She does not have problems with hot flashes.  She does have a little bit of vaginal wetness at times but it is difficult to distinguish from her progressing urinary incontinence.  On Labor Day she woke up in Melinda morning with weakness in Melinda left upper extremity.  She could not AB duct it and could not make a grip.  She had no speech difficulty or other focal weakness areas.  She was evaluated by her primary care physician who obtained a noncontrast brain MRI which did not show hemorrhage or tumor.  There was no obvious evidence of stroke.  Melinda left upper extremity has since recovered function  She is scheduled for routine diagnostic mammography on 04/12/2020 at [facility].   REVIEW OF SYSTEMS: Melinda Hudson tells me she is having problems with urinary continence.  She has to go frequently to Melinda bathroom including at night and she is sleeping with a depends now.  She has had them Moderna vaccine x2 and is waiting for Melinda booster to be approved to receive that.  She lost 2 cousins 27 and 73 2 COVID-19 and she tells me in [place] there have been many deaths including many old friends and also many young people.  This depresses her a little bit.  Aside from these issues a detailed review of systems today was stable   HISTORY OF PRESENT ILLNESS: From Melinda original intake note:  Melinda Hudson had mammography in [facility], which showed some architectural distortion in dense breasts.  She had an ultrasound there as well, but I do not  have that report.  [facility], and on 07/06 Dr. Sadie Hudson performed left breast ultrasound, which showed an ill defined solid mass at 12 o'clock, 2 cm from Melinda left nipple meaasuring 2.8 cm.  Ultrasound of Melinda left axilla showed a lymph node with thickened cortex suspicious for metastatic deposit.  Both Melinda breast mass and Melinda lymph node were biopsied that day, and Melinda pathology [phone] [phone]) showed Melinda mass to be an invasive ductal carcinoma with abundant mucin, low grade, strongly estrogen and progesterone receptor positive, HER-2 negative with a low proliferation fraction at 5%.  Melinda lymph node biopsy was negative.  With this information Melinda Hudson was referred to Melinda [facility], and bilateral breast MRIs were obtained on 07/10.  This showed a 3.6 cm lobulated enhancing mass in Melinda central left breast with no additional masses.  Melinda mildly enlarged left axillary lymph node previously biopsied was again seen by MRI.  It measured 2.1 cm.  There were no other abnormal appearing lymph nodes.  There was a small cyst incidentally noted in Melinda left lobe of Melinda liver.  Melinda Hudson has refused surgery. Her subsequent history is as detailed below.   PAST MEDICAL HISTORY: Past Medical History:  Diagnosis Date  . Breast cancer (Melinda Hudson)   . Cancer (Mel[place] Hudson)   . Personal history of chemotherapy    tamoxifen  history of: hypertension, season allergies, hypercholesterolemia.   PAST SURGICAL HISTORY: Past Surgical  History:  Procedure Laterality Date  . ABDOMINAL HYSTERECTOMY    . BACK SURGERY    . CATARACT EXTRACTION, BILATERAL    . LUMBAR LAMINECTOMY     under Melinda Hudson  . TONSILLECTOMY AND ADENOIDECTOMY    Right knee arthroscopy remotely under Dr. Wainer, s/p TAH/BSO with a prior history of fibroids   FAMILY HISTORY Family History  Problem Relation Age of Onset  . Heart attack Father   . Breast cancer Maternal Aunt   . Breast cancer Maternal Aunt   .  Ovarian cancer Neg Hx   Melinda Hudson's father died from a myocardial infarction at age 65.  Melinda Hudson's mother died with heart problems at age 64.  Melinda Hudson is one of 12 siblings, 3 sisters and 9 brothers.  There is no history of breast or ovarian cancer in Melinda immediate family, although 2 of Melinda Hudson's mother's sisters out of 4 had breast cancer late in life.   GYNECOLOGIC HISTORY: Melinda Hudson is GX, P0.  She took hormone replacement after her hysterectomy until about 2005.   SOCIAL HISTORY:  (Updated December 2014) She used to live in [place], and worked for [employer].  When she retired in 2001, she moved back to [place].  She is a widow, and lives in [place], which is a home for senior citizens.  She has an independent apartment, but if she needed nursing home services, it would be available through Melinda same facility.  Her sister, Melinda Hudson,  also lives in [place].  She is a former financial analyst with Melinda Hudson.  In case of any problems, Melinda Hudson would like us to contact Melinda, and Melinda's home number is [phone], and her cell number is [phone].    ADVANCED DIRECTIVES: in place   HEALTH MAINTENANCE: Social History   Tobacco Use  . Smoking status: Never Smoker  Substance Use Topics  . Alcohol use: No  . Drug use: No     Colonoscopy: 2007/Kothapalli  PAP: s/p hysterectomy  Bone density: May 2010/ normal  Lipid panel: "good", Dr. Blyth  Allergies  Allergen Reactions  . Sulfa Antibiotics Rash    Current Outpatient Medications  Medication Sig Dispense Refill  . anastrozole (ARIMIDEX) 1 MG tablet Take 1 tablet (1 mg total) by mouth daily. 90 tablet 4  . Calcium Carbonate-Vitamin D (CALTRATE 600+D) 600-400 MG-UNIT per tablet Take 1 tablet by mouth daily.      . fluticasone (FLONASE) 50 MCG/ACT nasal spray Place 2 sprays into Melinda nose daily. allergies    . furosemide (LASIX) 40 MG tablet Take 40 mg by mouth daily.      .  lisinopril-hydrochlorothiazide (PRINZIDE,ZESTORETIC) 20-25 MG per tablet Take 1 tablet by mouth daily.      . lovastatin (MEVACOR) 40 MG tablet Take 40 mg by mouth at bedtime.      . Multiple Vitamins-Minerals (CENTRUM SILVER PO) Take 1 tablet by mouth daily.      . omeprazole (PRILOSEC) 20 MG capsule     . potassium chloride SA (K-DUR,KLOR-CON) 20 MEQ tablet Take 20 mEq by mouth daily.      . sucralfate (CARAFATE) 1 G tablet      No current facility-administered medications for this visit.    OBJECTIVE: African-American woman in no acute distress  Vitals:   03/31/20 1420  BP: 128/69  Pulse: 72  Resp: 18  Temp: (!) 97.2 F (36.2 C)  SpO2: 100%   Wt Readings from Last 3 Encounters:    03/31/20 160 lb 3.2 oz (72.7 kg)  03/31/19 162 lb 9.6 oz (73.8 kg)  02/04/18 163 lb 1.6 oz (74 kg)   Body mass index is 30.27 kg/m.    ECOG FS:1 - Symptomatic but completely ambulatory  Sclerae unicteric, EOMs intact Wearing a mask No cervical or supraclavicular adenopathy Lungs no rales or rhonchi Heart regular rate and rhythm Abd soft, nontender, positive bowel sounds MSK no focal spinal tenderness, no upper extremity lymphedema Neuro: nonfocal, and specifically bilateral upper extremity abduction and bilateral grip are both 5/5 ; well oriented, appropriate affect Breasts: Melinda right breast is unremarkable.  Melinda left breast is status post remote biopsy.  There is no palpable mass.  Both axillae are benign  LAB RESULTS: Lab Results  Component Value Date   WBC 6.5 03/31/2020   NEUTROABS 4.3 03/31/2020   HGB 11.3 (L) 03/31/2020   HCT 34.6 (L) 03/31/2020   MCV 89.9 03/31/2020   PLT 220 03/31/2020      Chemistry      Component Value Date/Time   NA 139 03/31/2020 1359   NA 141 01/09/2017 1406   K 3.9 03/31/2020 1359   K 4.1 01/09/2017 1406   CL 105 03/31/2020 1359   CL 104 10/21/2012 1113   CO2 29 03/31/2020 1359   CO2 27 01/09/2017 1406   BUN 14 03/31/2020 1359   BUN 16.4  01/09/2017 1406   CREATININE 0.80 03/31/2020 1359   CREATININE 0.9 01/09/2017 1406      Component Value Date/Time   CALCIUM 9.2 03/31/2020 1359   CALCIUM 9.4 01/09/2017 1406   ALKPHOS 52 03/31/2020 1359   ALKPHOS 56 01/09/2017 1406   AST 25 03/31/2020 1359   AST 22 01/09/2017 1406   ALT 20 03/31/2020 1359   ALT 17 01/09/2017 1406   BILITOT 0.6 03/31/2020 1359   BILITOT 0.36 01/09/2017 1406       STUDIES: No results found.   ASSESSMENT: 86 y.o.  [facility] woman status post left breast biopsy in July 2011 for clinical T2 N0, [place] IIA invasive ductal carcinoma, grade 1, strongly estrogen and receptor positive, both at 100%, HER2/neu negative, with low MIB-1 of 5%. A left axillary lymph node biopsied at Melinda time of initial diagnosis was negative.   (1) Declined surgery.   (2) On neoadjuvant letrozole between July 2011 and October 2012. MRI at that time showed some evidence of tumor growth, letrozole was discontinued   (3) started on tamoxifen in early October 2012, switched to anastrozole starting November 2021 due to concerns regarding blood clots   PLAN:  Melinda Hudson is now just over 10 years out from initial diagnosis of breast cancer.  There is no evidence of disease progression.  Since she did not undergo surgery we do not have an endpoint for antiestrogens.  On Melinda other hand she is now about to start her 10th year of tamoxifen.  We do not have data for continuing tamoxifen beyond 10years.  A second concern of more important is Melinda fact that she had that left upper extremity weakness.  Even though her noncontrast MRI did not show obvious stroke and even though she has had a very good recovery I think it would be prudent to move to aromatase inhibitors which are not associated with increased clotting problems as compared to placebo.  We discussed Melinda possible toxicities side effects and complications of anastrozole and I have gone ahead and place an Melinda prescription for her.  She  will started November 1.  I am going to call her Melinda second week in December just to make sure she is tolerating it well and is not having arthralgias or myalgias from it.  Assuming that is Melinda case then she will stay on anastrozole indefinitely.  She is already scheduled for mammography later this month and I will see her again a year from now  She knows to call for any other issue that may develop before then  Total encounter time 25 minutes.*  Cully Luckow, Virgie Dad, MD  03/31/20 2:49 PM Medical Oncology and Hematology [facility] [place], [place] [address] Tel. [phone]    Fax. [phone]   I, Wilburn Mylar, am acting as scribe for Dr. Virgie Dad. Jovonna Nickell.  I, Lurline Del MD, have reviewed Melinda above documentation for accuracy and completeness, and I agree with Melinda above.    *Total Encounter Time as defined by [place] Centers for Medicare and Medicaid Services includes, in addition to Melinda face-to-face time of a [facility] visit (documented in Melinda note above) non-face-to-face time: obtaining and reviewing outside history, ordering and reviewing medications, tests or procedures, care coordination (communications with other health care professionals or caregivers) and documentation in Melinda medical record.

## 2020-03-31 ENCOUNTER — Other Ambulatory Visit: Payer: Self-pay

## 2020-03-31 ENCOUNTER — Inpatient Hospital Stay: Payer: Medicare Other

## 2020-03-31 ENCOUNTER — Inpatient Hospital Stay: Payer: Medicare Other | Attending: Oncology | Admitting: Oncology

## 2020-03-31 VITALS — BP 128/69 | HR 72 | Temp 97.2°F | Resp 18 | Ht 61.0 in | Wt 160.2 lb

## 2020-03-31 DIAGNOSIS — C50912 Malignant neoplasm of unspecified site of left female breast: Secondary | ICD-10-CM | POA: Insufficient documentation

## 2020-03-31 DIAGNOSIS — C50812 Malignant neoplasm of overlapping sites of left female breast: Secondary | ICD-10-CM | POA: Diagnosis not present

## 2020-03-31 DIAGNOSIS — R531 Weakness: Secondary | ICD-10-CM | POA: Insufficient documentation

## 2020-03-31 DIAGNOSIS — Z79899 Other long term (current) drug therapy: Secondary | ICD-10-CM | POA: Diagnosis not present

## 2020-03-31 DIAGNOSIS — I10 Essential (primary) hypertension: Secondary | ICD-10-CM | POA: Diagnosis not present

## 2020-03-31 DIAGNOSIS — Z79810 Long term (current) use of selective estrogen receptor modulators (SERMs): Secondary | ICD-10-CM | POA: Diagnosis not present

## 2020-03-31 DIAGNOSIS — Z170 Estrogen receptor positive status [ER+]: Secondary | ICD-10-CM | POA: Insufficient documentation

## 2020-03-31 LAB — CBC WITH DIFFERENTIAL (CANCER CENTER ONLY)
Abs Immature Granulocytes: 0.01 10*3/uL (ref 0.00–0.07)
Basophils Absolute: 0 10*3/uL (ref 0.0–0.1)
Basophils Relative: 0 %
Eosinophils Absolute: 0.1 10*3/uL (ref 0.0–0.5)
Eosinophils Relative: 1 %
HCT: 34.6 % — ABNORMAL LOW (ref 36.0–46.0)
Hemoglobin: 11.3 g/dL — ABNORMAL LOW (ref 12.0–15.0)
Immature Granulocytes: 0 %
Lymphocytes Relative: 23 %
Lymphs Abs: 1.5 10*3/uL (ref 0.7–4.0)
MCH: 29.4 pg (ref 26.0–34.0)
MCHC: 32.7 g/dL (ref 30.0–36.0)
MCV: 89.9 fL (ref 80.0–100.0)
Monocytes Absolute: 0.6 10*3/uL (ref 0.1–1.0)
Monocytes Relative: 9 %
Neutro Abs: 4.3 10*3/uL (ref 1.7–7.7)
Neutrophils Relative %: 67 %
Platelet Count: 220 10*3/uL (ref 150–400)
RBC: 3.85 MIL/uL — ABNORMAL LOW (ref 3.87–5.11)
RDW: 13.7 % (ref 11.5–15.5)
WBC Count: 6.5 10*3/uL (ref 4.0–10.5)
nRBC: 0 % (ref 0.0–0.2)

## 2020-03-31 LAB — CMP (CANCER CENTER ONLY)
ALT: 20 U/L (ref 0–44)
AST: 25 U/L (ref 15–41)
Albumin: 3.9 g/dL (ref 3.5–5.0)
Alkaline Phosphatase: 52 U/L (ref 38–126)
Anion gap: 5 (ref 5–15)
BUN: 14 mg/dL (ref 8–23)
CO2: 29 mmol/L (ref 22–32)
Calcium: 9.2 mg/dL (ref 8.9–10.3)
Chloride: 105 mmol/L (ref 98–111)
Creatinine: 0.8 mg/dL (ref 0.44–1.00)
GFR, Estimated: >60 mL/min (ref >60)
Glucose, Bld: 85 mg/dL (ref 70–99)
Potassium: 3.9 mmol/L (ref 3.5–5.1)
Sodium: 139 mmol/L (ref 135–145)
Total Bilirubin: 0.6 mg/dL (ref 0.3–1.2)
Total Protein: 6.8 g/dL (ref 6.5–8.1)

## 2020-03-31 MED ORDER — ANASTROZOLE 1 MG PO TABS: 1.0000 mg | ORAL_TABLET | Freq: Every day | ORAL | 4 refills | Status: DC

## 2020-04-05 ENCOUNTER — Telehealth: Payer: Self-pay | Admitting: Oncology

## 2020-04-05 NOTE — Telephone Encounter (Signed)
Scheduled per 10/7 los. Called and left a msg  

## 2020-04-06 ENCOUNTER — Other Ambulatory Visit: Payer: Self-pay | Admitting: Oncology

## 2020-04-12 ENCOUNTER — Ambulatory Visit
Admission: RE | Admit: 2020-04-12 | Discharge: 2020-04-12 | Disposition: A | Discharge status: Home / Self Care | Payer: Medicare Other | Source: Ambulatory Visit | Attending: Oncology | Admitting: Oncology

## 2020-04-12 DIAGNOSIS — C50912 Malignant neoplasm of unspecified site of left female breast: Secondary | ICD-10-CM

## 2020-05-10 ENCOUNTER — Telehealth: Payer: Self-pay

## 2020-05-10 NOTE — Telephone Encounter (Signed)
Pt called for clarification of appt. Pt thought her telehealth visit was for 05/09/20. This LPN informed pt it is 06/08/20 at 3 PM. Pt verbalized thanks and understanding.

## 2020-06-07 NOTE — Progress Notes (Signed)
[facility]  Telephone:(336) [phone] Fax:(336) [phone]   ID: Mellina Benison   DOB: [birth year]  MR#: [id number]  TDS#:[id number]  Patient Care Team: Raelene Bott, MD as PCP - General (Internal Medicine) OTHER:  Abbe Amsterdam MD  I connected with Carolynne Edouard on 06/08/20 at  3:00 PM EST by telephone visit and verified that I am speaking with the correct person using two identifiers.   I discussed the limitations, risks, security and privacy concerns of performing an evaluation and management service by telemedicine and the availability of in-person appointments. I also discussed with the patient that there may be a patient responsible charge related to this service. The patient expressed understanding and agreed to proceed.    Other persons participating in the visit and their role in the encounter: None  Patient's location: Home Provider's location: Clinic  CHIEF COMPLAINT:  Left Breast Cancer  CURRENT TREATMENT: anastrozole   INTERVAL HISTORY: Kadisha was contacted today for follow-up of her left breast cancer.  She was switched to anastrozole at her last visit on 03/31/2020.  She had headaches a couple of days but that has resolved.  She has had no problems with hot flashes.  She is having some insomnia and feels a little weak and tired.  Aside from these issues which may or may not be related she is doing very well.  Since her last visit, she underwent bilateral diagnostic mammography with tomography at [facility] on 04/12/2020 showing: breast density category C; no evidence of malignancy in either breast.    REVIEW OF SYSTEMS: Kebra tells me she is having bowel movements every other day instead of daily the bowel movements however are not hard.  She is not gaining or losing weight and has no abdominal cramps or discomfort.  Has been no abdominal swelling.  A detailed review of systems today was otherwise stable   COVID 19 VACCINATION  STATUS: fully vaccinated (Moderna)   HISTORY OF PRESENT ILLNESS: From the original intake note:  The patient had mammography in [facility], which showed some architectural distortion in dense breasts.  She had an ultrasound there as well, but I do not have that report.  She was referred to [facility] for [facility] evaluation, and on 07/06 Dr. Sadie Haber performed left breast ultrasound, which showed an ill defined solid mass at 12 o'clock, 2 cm from the left nipple meaasuring 2.8 cm.  Ultrasound of the left axilla showed a lymph node with thickened cortex suspicious for metastatic deposit.  Both the breast mass and the lymph node were biopsied that day, and the pathology [id number]) showed the mass to be an invasive ductal carcinoma with abundant mucin, low grade, strongly estrogen and progesterone receptor positive, HER-2 negative with a low proliferation fraction at 5%.  The lymph node biopsy was negative.  With this information the patient was referred to Dr. Brantley Stage, and bilateral breast MRIs were obtained on 07/10.  This showed a 3.6 cm lobulated enhancing mass in the central left breast with no additional masses.  The mildly enlarged left axillary lymph node previously biopsied was again seen by MRI.  It measured 2.1 cm.  There were no other abnormal appearing lymph nodes.  There was a small cyst incidentally noted in the left lobe of the liver.  The patient has refused surgery. Her subsequent history is as detailed below.   PAST MEDICAL HISTORY: Past Medical History:  Diagnosis Date  . Breast cancer ([place]) 2011   Pt. opted  to not have surgery  . Cancer ([place])   . Personal history of chemotherapy    tamoxifen  history of: hypertension, season allergies, hypercholesterolemia.   PAST SURGICAL HISTORY: Past Surgical History:  Procedure Laterality Date  . ABDOMINAL HYSTERECTOMY    . BACK SURGERY    . CATARACT EXTRACTION, BILATERAL    . LUMBAR LAMINECTOMY     under [name]   . TONSILLECTOMY AND ADENOIDECTOMY    Right knee arthroscopy remotely under Dr. Noemi Chapel, s/p TAH/BSO with a prior history of fibroids   FAMILY HISTORY Family History  Problem Relation Age of Onset  . Heart attack Father   . Breast cancer Maternal Aunt   . Breast cancer Maternal Aunt   . Ovarian cancer Neg Hx   The patient's father died from a myocardial infarction at age 51.  The patient's mother died with heart problems at age 85.  The patient is one of 12 siblings, 3 sisters and 9 brothers.  There is no history of breast or ovarian cancer in the immediate family, although 2 of the patient's mother's sisters out of 1 had breast cancer late in life.   GYNECOLOGIC HISTORY: The patient is GX, P0.  She took hormone replacement after her hysterectomy until about 2005.   SOCIAL HISTORY:  (Updated December 2014) She used to live in [place], and worked for [employer].  When she retired in 2001, she moved back to [place].  She is a widow, and lives in [facility], which is a home for senior citizens.  She has an independent apartment, but if she needed nursing home services, it would be available through the same facility.  Her sister, [name],  also lives in [place].  She is a former Engineer, mining.  In case of any problems, Lurene would like [place] to contact [place], and Delois's home number is [phone], and her cell number is [phone].    ADVANCED DIRECTIVES: in place   HEALTH MAINTENANCE: Social History   Tobacco Use  . Smoking status: Never Smoker  Substance Use Topics  . Alcohol use: No  . Drug use: No     Colonoscopy: 2007/[facility]  PAP: s/p hysterectomy  Bone density: May 2010/ normal  Lipid panel: "good", Dr. Charlett Blake  Allergies  Allergen Reactions  . Sulfa Antibiotics Rash    Current Outpatient Medications  Medication Sig Dispense Refill  . anastrozole (ARIMIDEX) 1 MG tablet Take 1 tablet (1 mg total) by mouth  daily. 90 tablet 4  . Calcium Carbonate-Vitamin D (CALTRATE 600+D) 600-400 MG-UNIT per tablet Take 1 tablet by mouth daily.      . fluticasone (FLONASE) 50 MCG/ACT nasal spray Place 2 sprays into the nose daily. allergies    . furosemide (LASIX) 40 MG tablet Take 40 mg by mouth daily.      Marland Kitchen lisinopril-hydrochlorothiazide (PRINZIDE,ZESTORETIC) 20-25 MG per tablet Take 1 tablet by mouth daily.      Marland Kitchen lovastatin (MEVACOR) 40 MG tablet Take 40 mg by mouth at bedtime.      . Multiple Vitamins-Minerals (CENTRUM SILVER PO) Take 1 tablet by mouth daily.      Marland Kitchen omeprazole (PRILOSEC) 20 MG capsule     . potassium chloride SA (K-DUR,KLOR-CON) 20 MEQ tablet Take 20 mEq by mouth daily.      . sucralfate (CARAFATE) 1 G tablet      No current facility-administered medications for this visit.    OBJECTIVE: African-American woman in no acute distress  There were no vitals filed for this visit. Wt Readings from Last 3 Encounters:  03/31/20 160 lb 3.2 oz (72.7 kg)  03/31/19 162 lb 9.6 oz (73.8 kg)  02/04/18 163 lb 1.6 oz (74 kg)   There is no height or weight on file to calculate BMI.    ECOG FS:1 - Symptomatic but completely ambulatory  Telemedicine  LAB RESULTS: Lab Results  Component Value Date   WBC 6.5 03/31/2020   NEUTROABS 4.3 03/31/2020   HGB 11.3 (L) 03/31/2020   HCT 34.6 (L) 03/31/2020   MCV 89.9 03/31/2020   PLT 220 03/31/2020      Chemistry      Component Value Date/Time   NA 139 03/31/2020 1359   NA 141 01/09/2017 1406   K 3.9 03/31/2020 1359   K 4.1 01/09/2017 1406   CL 105 03/31/2020 1359   CL 104 10/21/2012 1113   CO2 29 03/31/2020 1359   CO2 27 01/09/2017 1406   BUN 14 03/31/2020 1359   BUN 16.4 01/09/2017 1406   CREATININE 0.80 03/31/2020 1359   CREATININE 0.9 01/09/2017 1406      Component Value Date/Time   CALCIUM 9.2 03/31/2020 1359   CALCIUM 9.4 01/09/2017 1406   ALKPHOS 52 03/31/2020 1359   ALKPHOS 56 01/09/2017 1406   AST 25 03/31/2020 1359   AST 22  01/09/2017 1406   ALT 20 03/31/2020 1359   ALT 17 01/09/2017 1406   BILITOT 0.6 03/31/2020 1359   BILITOT 0.36 01/09/2017 1406      STUDIES: No results found.   ASSESSMENT: 86 y.o.  [facility] woman status post left breast biopsy in July 2011 for clinical T2 N0, Stage IIA invasive ductal carcinoma, grade 1, strongly estrogen and receptor positive, both at 100%, HER2/neu negative, with low MIB-1 of 5%. A left axillary lymph node biopsied at the time of initial diagnosis was negative.   (1) Declined surgery.   (2) On neoadjuvant letrozole between July 2011 and October 2012. MRI at that time showed some evidence of tumor growth, letrozole was discontinued   (3) started on tamoxifen in early October 2012, switched to anastrozole starting November 2021 due to concerns regarding blood clots   PLAN:  Dione is a little over 10 years out from definitive surgery for breast cancer with no evidence of disease progression.  This is very favorable.  Since as she never had surgery for breast cancer we really do not know when to stop the antiestrogens.  We just switched her to anastrozole because of concerns regarding clots with tamoxifen which she otherwise tolerated very well.  She is tolerating the anastrozole well and she tells me the vaginal wetness or discharge she had with tamoxifen is greatly diminished  Her mammogram currently is also very favorable.  I am going to see her in person about a year from now.  She knows to call for any other issue that may develop before then.  Jamelia Varano, Virgie Dad, MD  06/08/20 3:26 PM Medical Oncology and Hematology [facility] [place], [place] [address] Tel. [phone]    Fax. [phone]   I, Wilburn Mylar, am acting as scribe for Dr. Virgie Dad. Daniell Mancinas.  I, Lurline Del MD, have reviewed the above documentation for accuracy and completeness, and I agree with the above.   *Total Encounter Time as defined by  the Centers for Medicare and Medicaid Services includes, in addition to the face-to-face time of a patient visit (documented in the  note above) non-face-to-face time: obtaining and reviewing outside history, ordering and reviewing medications, tests or procedures, care coordination (communications with other health care professionals or caregivers) and documentation in the medical record.

## 2020-06-08 ENCOUNTER — Inpatient Hospital Stay: Payer: Medicare Other | Attending: Oncology | Admitting: Oncology

## 2020-06-08 DIAGNOSIS — Z170 Estrogen receptor positive status [ER+]: Secondary | ICD-10-CM | POA: Diagnosis not present

## 2020-06-08 DIAGNOSIS — C50812 Malignant neoplasm of overlapping sites of left female breast: Secondary | ICD-10-CM | POA: Diagnosis not present

## 2020-06-09 ENCOUNTER — Telehealth: Payer: Self-pay | Admitting: Oncology

## 2020-06-09 NOTE — Telephone Encounter (Signed)
Scheduled appts per 12/15 los. Mailed appt reminder and calendar per pt's request in 12/15 LOS

## 2020-07-26 ENCOUNTER — Telehealth: Payer: Self-pay | Admitting: *Deleted

## 2020-07-26 NOTE — Telephone Encounter (Signed)
This RN returned VM left by the patient stating she need of return call " it is very important ".  Per discussion- Melinda [facility] states she has had 2 episodes over the past 2 weeks of incontinent diarrhea.  She then has had some intermittent bleeding she believes is vaginal. She notices it mostly when she urinates ( no pain or burning ) and she placed a sanitary napkin with noted red blood.  Bleeding is not profuse " but kind of like a period " - may only occur for 6 hours then have nothing,  She denies any discomfort.  Per phone discussion she states " I am 21 - and can I just age out of having to do this cancer treatment and be out of the cancer business ?"  This RN informed pt to stop the anastrozole at this time- above would be reviewed with MD and call returned to her.

## 2020-07-27 ENCOUNTER — Telehealth: Payer: Self-pay | Admitting: Oncology

## 2020-07-27 ENCOUNTER — Telehealth: Payer: Self-pay | Admitting: *Deleted

## 2020-07-27 NOTE — Telephone Encounter (Signed)
MD aware of phone discussion 07/26/2020- with request for visit for further follow up.  This RN called pt's home and obtained AM- message left informing her to call to this RN with update on symptoms as well as she will be getting a call from scheduling.  This RN's name given for return call.

## 2020-07-27 NOTE — Telephone Encounter (Signed)
Scheduled apt per 2/2 sch msg - left message for patient with appt date and time

## 2020-08-04 ENCOUNTER — Other Ambulatory Visit: Payer: Medicare Other

## 2020-08-04 ENCOUNTER — Ambulatory Visit: Payer: Medicare Other | Admitting: Adult Health

## 2020-08-10 ENCOUNTER — Other Ambulatory Visit: Payer: Self-pay

## 2020-08-10 DIAGNOSIS — Z170 Estrogen receptor positive status [ER+]: Secondary | ICD-10-CM

## 2020-08-10 DIAGNOSIS — C50812 Malignant neoplasm of overlapping sites of left female breast: Secondary | ICD-10-CM

## 2020-08-10 NOTE — Progress Notes (Signed)
[facility]  Telephone:(336) [phone] Fax:(336) [phone]   ID: Melinda [place]   DOB: October 06, 1933  MR#: [id number]  QMG#:[id number]  Patient Care Team: Melinda Bott, Hudson as PCP - General (Internal Medicine) OTHER:  Melinda [facility]   CHIEF COMPLAINT:  Left Breast Cancer  CURRENT TREATMENT: anastrozole   INTERVAL HISTORY: Melinda Hudson is here today for f/u of her left sided ER positive breast cancer.  She opted to forego surgery.  Since her last visit she underwent a bilateral diagnostic mammogram that showed no evidence of malignancy and breast density category C.    Melinda Hudson notes that she started on Anastrozole she developed diarrhea.  She stopped taking it on 07/30/2020 and didn't reorder it.  She also has had some bleeding and thinks it happened with Melinda diarrhea, she says it could have happened when she urinated, which stopped once she stopped taking Melinda anastrozole.    REVIEW OF SYSTEMS: Melinda Hudson is doing quite well, other than Melinda diarrhea that she experienced while taking Melinda anastrozole.  She has no other concerns today and a detailed ROS was otherwise non contributory.     COVID 19 VACCINATION STATUS: fully vaccinated (Moderna)   HISTORY OF PRESENT ILLNESS: From Melinda original intake note:  Melinda patient had mammography in [place], which showed some architectural distortion in dense breasts.  She had an ultrasound there as well, but I do not have that report.  She was referred to [facility] for further evaluation, and on 07/06 Dr. Sadie Hudson performed left breast ultrasound, which showed an ill defined solid mass at 12 o'clock, 2 cm from Melinda left nipple meaasuring 2.8 cm.  Ultrasound of Melinda left axilla showed a lymph node with thickened cortex suspicious for metastatic deposit.  Both Melinda breast mass and Melinda lymph node were biopsied that day, and Melinda pathology [id number]) showed Melinda mass to be an invasive ductal carcinoma with abundant mucin, low grade,  strongly estrogen and progesterone receptor positive, HER-2 negative with a low proliferation fraction at 5%.  Melinda lymph node biopsy was negative.  With this information Melinda patient was referred to Melinda [facility], and bilateral breast MRIs were obtained on 07/10.  This showed a 3.6 cm lobulated enhancing mass in Melinda central left breast with no additional masses.  Melinda mildly enlarged left axillary lymph node previously biopsied was again seen by MRI.  It measured 2.1 cm.  There were no other abnormal appearing lymph nodes.  There was a small cyst incidentally noted in Melinda left lobe of Melinda liver.  Melinda patient has refused surgery. Her subsequent history is as detailed below.   PAST MEDICAL HISTORY: Past Medical History:  Diagnosis Date  . Breast cancer ([place]) 2011   Pt. opted to not have surgery  . Cancer ([place])   . Personal history of chemotherapy    tamoxifen  history of: hypertension, season allergies, hypercholesterolemia.   PAST SURGICAL HISTORY: Past Surgical History:  Procedure Laterality Date  . ABDOMINAL HYSTERECTOMY    . BACK SURGERY    . CATARACT EXTRACTION, BILATERAL    . LUMBAR LAMINECTOMY     under Mel[facility]  . TONSILLECTOMY AND ADENOIDECTOMY    Right knee arthroscopy remotely under Dr. Noemi Chapel, s/p TAH/BSO with a prior history of fibroids   FAMILY HISTORY Family History  Problem Relation Age of Onset  . Heart attack Father   . Breast cancer Maternal Aunt   . Breast cancer Maternal Aunt   . Ovarian cancer Neg Hx  Melinda patient's father died from a myocardial infarction at age 54.  Melinda patient's mother died with heart problems at age 25.  Melinda patient is one of 12 siblings, 3 sisters and 9 brothers.  There is no history of breast or ovarian cancer in Melinda immediate family, although 2 of Melinda patient's mother's sisters out of 34 had breast cancer late in life.   GYNECOLOGIC HISTORY: Melinda patient is GX, P0.  She took hormone replacement after her hysterectomy until about  2005.   SOCIAL HISTORY:  (Updated December 2014) She used to live in [place], and worked for [place].  When she retired in 2001, she moved back to [place].  She is a widow, and lives in [place], which is a home for senior citizens.  She has an independent apartment, but if she needed nursing home services, it would be available through Melinda same facility.  Her sister, Melinda [facility],  also lives in [place].  She is a former Engineer, mining.  In case of any problems, Melinda Hudson would like [place] to contact Melinda Hudson, and Melinda Hudson's home number is [phone], and her cell number is [phone].    ADVANCED DIRECTIVES: in place   HEALTH MAINTENANCE: Social History   Tobacco Use  . Smoking status: Never Smoker  Substance Use Topics  . Alcohol use: No  . Drug use: No     Colonoscopy: 2007/[facility]  PAP: s/p hysterectomy  Bone density: May 2010/ normal  Lipid panel: "good", Dr. Charlett Blake  Allergies  Allergen Reactions  . Sulfa Antibiotics Rash    Current Outpatient Medications  Medication Sig Dispense Refill  . fluticasone (FLONASE) 50 MCG/ACT nasal spray Place 2 sprays into Melinda nose daily. allergies    . furosemide (LASIX) 40 MG tablet Take 40 mg by mouth daily.    Marland Kitchen lisinopril-hydrochlorothiazide (PRINZIDE,ZESTORETIC) 20-25 MG per tablet Take 1 tablet by mouth daily.    Marland Kitchen omeprazole (PRILOSEC) 20 MG capsule     . potassium chloride SA (K-DUR,KLOR-CON) 20 MEQ tablet Take 20 mEq by mouth daily.    . sucralfate (CARAFATE) 1 G tablet     . anastrozole (ARIMIDEX) 1 MG tablet Take 1 tablet (1 mg total) by mouth daily. (Patient not taking: Reported on 08/11/2020) 90 tablet 4   No current facility-administered medications for this visit.    OBJECTIVE:  Vitals:   08/11/20 1435  BP: (!) 149/62  Pulse: 76  Resp: 18  Temp: (!) 97.5 F (36.4 C)  SpO2: 100%   Wt Readings from Last 3 Encounters:  08/11/20 160 lb (72.6 kg)  03/31/20 160 lb 3.2  oz (72.7 kg)  03/31/19 162 lb 9.6 oz (73.8 kg)   Body mass index is 30.23 kg/m.    ECOG FS:1 - Symptomatic but completely ambulatory GENERAL: Patient is a well appearing female in no acute distress HEENT:  Sclerae anicteric.  Oropharynx clear and moist. No ulcerations or evidence of oropharyngeal candidiasis. Neck is supple.  NODES:  No cervical, supraclavicular, or axillary lymphadenopathy palpated.  BREAST EXAM:  Benign bilateral breast exam LUNGS:  Clear to auscultation bilaterally.  No wheezes or rhonchi. HEART:  Regular rate and rhythm. No murmur appreciated. ABDOMEN:  Soft, nontender.  Positive, normoactive bowel sounds. No organomegaly palpated. MSK:  No focal spinal tenderness to palpation. Full range of motion bilaterally in Melinda upper extremities. EXTREMITIES:  No peripheral edema.   SKIN:  Clear with no obvious rashes or skin changes. No nail dyscrasia. NEURO:  Nonfocal.  Well oriented.  Appropriate affect.    LAB RESULTS: Lab Results  Component Value Date   WBC 6.0 08/11/2020   NEUTROABS 3.8 08/11/2020   HGB 11.7 (L) 08/11/2020   HCT 36.0 08/11/2020   MCV 91.1 08/11/2020   PLT 226 08/11/2020      Chemistry      Component Value Date/Melinda   NA 139 08/11/2020 1351   NA 141 01/09/2017 1406   K 4.0 08/11/2020 1351   K 4.1 01/09/2017 1406   CL 105 08/11/2020 1351   CL 104 10/21/2012 1113   CO2 27 08/11/2020 1351   CO2 27 01/09/2017 1406   BUN 17 08/11/2020 1351   BUN 16.4 01/09/2017 1406   CREATININE 0.80 08/11/2020 1351   CREATININE 0.9 01/09/2017 1406      Component Value Date/Melinda   CALCIUM 9.3 08/11/2020 1351   CALCIUM 9.4 01/09/2017 1406   ALKPHOS 70 08/11/2020 1351   ALKPHOS 56 01/09/2017 1406   AST 30 08/11/2020 1351   AST 22 01/09/2017 1406   ALT 26 08/11/2020 1351   ALT 17 01/09/2017 1406   BILITOT 0.4 08/11/2020 1351   BILITOT 0.36 01/09/2017 1406      STUDIES: CLINICAL DATA:  Patient is diagnosed with LEFT breast cancer in 2011. Patient  currently takes tamoxifen and is scheduled to begin anastrozole. Patient elected not to have surgery.  EXAM: DIGITAL DIAGNOSTIC BILATERAL MAMMOGRAM WITH TOMO AND CAD  COMPARISON:  Previous exam(s).  ACR Breast Density Category c: Melinda breast tissue is heterogeneously dense, which may obscure small masses.  FINDINGS: Spiral shaped tissue marker clip is identified in Melinda UPPER-OUTER QUADRANT of Melinda LEFT breast. No mass, distortion, or suspicious microcalcifications identified in either breast.  Mammographic images were processed with CAD.  IMPRESSION: No mammographic evidence for malignancy.  RECOMMENDATION: Recommend bilateral diagnostic mammogram in 1 year.  I have discussed Melinda findings and recommendations with Melinda patient. If applicable, a reminder letter will be sent to Melinda patient regarding Melinda next appointment.  BI-RADS CATEGORY  6: Known biopsy-proven malignancy.   Electronically Signed   By: Nolon Nations M.D.   On: 04/12/2020 15:16   ASSESSMENT: 87 y.o.  [facility] woman status post left breast biopsy in July 2011 for clinical T2 N0, [place] IIA invasive ductal carcinoma, grade 1, strongly estrogen and receptor positive, both at 100%, HER2/neu negative, with low MIB-1 of 5%. A left axillary lymph node biopsied at Mel[facility] of initial diagnosis was negative.   (1) Declined surgery.   (2) On neoadjuvant letrozole between July 2011 and October 2012. MRI at that Melinda showed some evidence of tumor growth, letrozole was discontinued   (3) started on tamoxifen in early October 2012, switched to anastrozole starting November 2021 due to concerns regarding blood clots  (4) Has opted to discontinue Anastrozole due to GI concerns and declines Exemestane or any other antiestrogen therapy.    PLAN:  Kortlyn is here for f/u of her Hudson IIA ER/PR + breast cancer.  She received 10 years of antiestrogen therapy for her breast cancer and declined surgery.  She has  opted to discontinue Melinda Anastrozole.    I reviewed with Melinda Hudson that her situation is unique and one that we do not have any data to support.  I reviewed with her that I would recommend that she try Exemestane to see if she tolerates it to prevent her breast cancer from growing.  I let her know that we don't have any substantial data to  support not having surgery and foregoing AET after 10 years.  I reviewed with her that there is a risk that her cancer could grow back in her breast, or go somewhere outside Melinda breast.    After discussion, Melinda Hudson verbalized understanding of Melinda above risks, and has opted to forego oral antiestrogen therapy.  Due to this, we will get a left breast mammogram and ultrasound in 2 months, which will be Melinda midway point from her most recent mammogram.  She understands this.    We will see her back in 6 months Melinda.  She knows to call for any questions that may arise between now and her next appointment.  We are happy to see her sooner if needed.   Total encounter Melinda: 20 minutes*  Wilber Bihari, NP 08/11/20 2:55 PM Medical Oncology and Hematology [facility] [place],  [address] Tel. [phone]    Fax. [phone]  *Total Encounter Melinda as defined by Melinda Centers for Medicare and Medicaid Services includes, in addition to Melinda face-to-face Melinda of a patient visit (documented in Melinda note above) non-face-to-face Melinda: obtaining and reviewing outside history, ordering and reviewing medications, tests or procedures, care coordination (communications with other health care professionals or caregivers) and documentation in Melinda medical record.

## 2020-08-11 ENCOUNTER — Inpatient Hospital Stay (HOSPITAL_BASED_OUTPATIENT_CLINIC_OR_DEPARTMENT_OTHER): Payer: Medicare Other | Admitting: Adult Health

## 2020-08-11 ENCOUNTER — Other Ambulatory Visit: Payer: Self-pay

## 2020-08-11 ENCOUNTER — Encounter: Payer: Self-pay | Admitting: Adult Health

## 2020-08-11 ENCOUNTER — Telehealth: Payer: Self-pay | Admitting: Adult Health

## 2020-08-11 ENCOUNTER — Inpatient Hospital Stay: Payer: Medicare Other | Attending: Oncology

## 2020-08-11 VITALS — BP 149/62 | HR 76 | Temp 97.5°F | Resp 18 | Ht 61.0 in | Wt 160.0 lb

## 2020-08-11 DIAGNOSIS — C50912 Malignant neoplasm of unspecified site of left female breast: Secondary | ICD-10-CM | POA: Diagnosis present

## 2020-08-11 DIAGNOSIS — Z170 Estrogen receptor positive status [ER+]: Secondary | ICD-10-CM | POA: Insufficient documentation

## 2020-08-11 DIAGNOSIS — C50812 Malignant neoplasm of overlapping sites of left female breast: Secondary | ICD-10-CM | POA: Diagnosis not present

## 2020-08-11 DIAGNOSIS — Z79811 Long term (current) use of aromatase inhibitors: Secondary | ICD-10-CM | POA: Diagnosis not present

## 2020-08-11 DIAGNOSIS — R197 Diarrhea, unspecified: Secondary | ICD-10-CM | POA: Insufficient documentation

## 2020-08-11 LAB — CBC WITH DIFFERENTIAL (CANCER CENTER ONLY)
Abs Immature Granulocytes: 0.01 10*3/uL (ref 0.00–0.07)
Basophils Absolute: 0 10*3/uL (ref 0.0–0.1)
Basophils Relative: 0 %
Eosinophils Absolute: 0.1 10*3/uL (ref 0.0–0.5)
Eosinophils Relative: 2 %
HCT: 36 % (ref 36.0–46.0)
Hemoglobin: 11.7 g/dL — ABNORMAL LOW (ref 12.0–15.0)
Immature Granulocytes: 0 %
Lymphocytes Relative: 24 %
Lymphs Abs: 1.4 10*3/uL (ref 0.7–4.0)
MCH: 29.6 pg (ref 26.0–34.0)
MCHC: 32.5 g/dL (ref 30.0–36.0)
MCV: 91.1 fL (ref 80.0–100.0)
Monocytes Absolute: 0.6 10*3/uL (ref 0.1–1.0)
Monocytes Relative: 10 %
Neutro Abs: 3.8 10*3/uL (ref 1.7–7.7)
Neutrophils Relative %: 64 %
Platelet Count: 226 10*3/uL (ref 150–400)
RBC: 3.95 MIL/uL (ref 3.87–5.11)
RDW: 13.4 % (ref 11.5–15.5)
WBC Count: 6 10*3/uL (ref 4.0–10.5)
nRBC: 0 % (ref 0.0–0.2)

## 2020-08-11 LAB — CMP (CANCER CENTER ONLY)
ALT: 26 U/L (ref 0–44)
AST: 30 U/L (ref 15–41)
Albumin: 4.2 g/dL (ref 3.5–5.0)
Alkaline Phosphatase: 70 U/L (ref 38–126)
Anion gap: 7 (ref 5–15)
BUN: 17 mg/dL (ref 8–23)
CO2: 27 mmol/L (ref 22–32)
Calcium: 9.3 mg/dL (ref 8.9–10.3)
Chloride: 105 mmol/L (ref 98–111)
Creatinine: 0.8 mg/dL (ref 0.44–1.00)
GFR, Estimated: >60 mL/min (ref >60)
Glucose, Bld: 89 mg/dL (ref 70–99)
Potassium: 4 mmol/L (ref 3.5–5.1)
Sodium: 139 mmol/L (ref 135–145)
Total Bilirubin: 0.4 mg/dL (ref 0.3–1.2)
Total Protein: 7 g/dL (ref 6.5–8.1)

## 2020-08-11 NOTE — Telephone Encounter (Signed)
Scheduled appts per 2/17 los. Gave pt a printout of AVS.

## 2020-10-03 ENCOUNTER — Other Ambulatory Visit: Payer: Self-pay | Admitting: Oncology

## 2020-10-03 DIAGNOSIS — C50912 Malignant neoplasm of unspecified site of left female breast: Secondary | ICD-10-CM

## 2021-02-07 ENCOUNTER — Other Ambulatory Visit: Payer: Self-pay | Admitting: *Deleted

## 2021-02-07 DIAGNOSIS — Z170 Estrogen receptor positive status [ER+]: Secondary | ICD-10-CM

## 2021-02-07 DIAGNOSIS — C50812 Malignant neoplasm of overlapping sites of left female breast: Secondary | ICD-10-CM

## 2021-02-08 ENCOUNTER — Other Ambulatory Visit: Payer: Self-pay

## 2021-02-08 ENCOUNTER — Inpatient Hospital Stay: Payer: Medicare Other

## 2021-02-08 ENCOUNTER — Inpatient Hospital Stay: Payer: Medicare Other | Attending: Oncology | Admitting: Oncology

## 2021-02-08 VITALS — BP 156/65 | HR 78 | Temp 97.9°F | Resp 16 | Ht 61.0 in | Wt 159.7 lb

## 2021-02-08 DIAGNOSIS — C50912 Malignant neoplasm of unspecified site of left female breast: Secondary | ICD-10-CM | POA: Insufficient documentation

## 2021-02-08 DIAGNOSIS — Z170 Estrogen receptor positive status [ER+]: Secondary | ICD-10-CM | POA: Diagnosis not present

## 2021-02-08 DIAGNOSIS — Z9221 Personal history of antineoplastic chemotherapy: Secondary | ICD-10-CM | POA: Diagnosis not present

## 2021-02-08 DIAGNOSIS — C50812 Malignant neoplasm of overlapping sites of left female breast: Secondary | ICD-10-CM | POA: Diagnosis not present

## 2021-02-08 DIAGNOSIS — Z79810 Long term (current) use of selective estrogen receptor modulators (SERMs): Secondary | ICD-10-CM | POA: Diagnosis not present

## 2021-02-08 DIAGNOSIS — Z79899 Other long term (current) drug therapy: Secondary | ICD-10-CM | POA: Insufficient documentation

## 2021-02-08 LAB — CMP (CANCER CENTER ONLY)
ALT: 22 U/L (ref 0–44)
AST: 21 U/L (ref 15–41)
Albumin: 4 g/dL (ref 3.5–5.0)
Alkaline Phosphatase: 79 U/L (ref 38–126)
Anion gap: 8 (ref 5–15)
BUN: 19 mg/dL (ref 8–23)
CO2: 27 mmol/L (ref 22–32)
Calcium: 9.2 mg/dL (ref 8.9–10.3)
Chloride: 105 mmol/L (ref 98–111)
Creatinine: 0.8 mg/dL (ref 0.44–1.00)
GFR, Estimated: >60 mL/min (ref >60)
Glucose, Bld: 84 mg/dL (ref 70–99)
Potassium: 4.2 mmol/L (ref 3.5–5.1)
Sodium: 140 mmol/L (ref 135–145)
Total Bilirubin: 0.3 mg/dL (ref 0.3–1.2)
Total Protein: 6.8 g/dL (ref 6.5–8.1)

## 2021-02-08 LAB — CBC WITH DIFFERENTIAL (CANCER CENTER ONLY)
Abs Immature Granulocytes: 0.01 10*3/uL (ref 0.00–0.07)
Basophils Absolute: 0 10*3/uL (ref 0.0–0.1)
Basophils Relative: 1 %
Eosinophils Absolute: 0.1 10*3/uL (ref 0.0–0.5)
Eosinophils Relative: 1 %
HCT: 33.2 % — ABNORMAL LOW (ref 36.0–46.0)
Hemoglobin: 11.1 g/dL — ABNORMAL LOW (ref 12.0–15.0)
Immature Granulocytes: 0 %
Lymphocytes Relative: 24 %
Lymphs Abs: 1.5 10*3/uL (ref 0.7–4.0)
MCH: 29.9 pg (ref 26.0–34.0)
MCHC: 33.4 g/dL (ref 30.0–36.0)
MCV: 89.5 fL (ref 80.0–100.0)
Monocytes Absolute: 0.6 10*3/uL (ref 0.1–1.0)
Monocytes Relative: 10 %
Neutro Abs: 4.1 10*3/uL (ref 1.7–7.7)
Neutrophils Relative %: 64 %
Platelet Count: 238 10*3/uL (ref 150–400)
RBC: 3.71 MIL/uL — ABNORMAL LOW (ref 3.87–5.11)
RDW: 13.7 % (ref 11.5–15.5)
WBC Count: 6.2 10*3/uL (ref 4.0–10.5)
nRBC: 0 % (ref 0.0–0.2)

## 2021-02-08 NOTE — Progress Notes (Signed)
[place]  [phone]) [phone] Fax:(336) [phone]   ID: Melinda Hudson   DOB: 1934/06/08  MR#: [id number]  ULA#:[id number]  Patient Care Team: Raelene Bott, MD as PCP - General (Internal Medicine) OTHER:  Abbe Amsterdam MD   CHIEF COMPLAINT:  Left Breast Cancer  CURRENT TREATMENT: observation   INTERVAL HISTORY: Melinda Hudson returns today for follow up of her left sided ER positive breast cancer.  She never underwent surgery and continues under observation.  Most recent mammography October 2021 found no mass distortion or suspicious microcalcifications in the upper outer quadrant of the left breast associated with a tissue marker clip from the original biopsy.  She is scheduled for annual diagnostic bilateral mammogram on 04/13/2021.   REVIEW OF SYSTEMS: Melinda Hudson stopped her antiestrogens in February 2022 because she thought they were upsetting her stomach.  She did not otherwise feel any different going off the medication.  She had a fall last year and then another fall 3 weeks ago.  She was evaluated for this by her primary care doctor Dr. Heber [place] [facility] and she did have slight injury to the right shoulder.  She also has arthritis in the left knee.  Otherwise a detailed review of systems today was stable   COVID 19 VACCINATION STATUS: fully vaccinated [place]inda Hudson) with 2 boosters as of August 2022.  Has not had COVID   HISTORY OF PRESENT ILLNESS: From the original intake note:  The patient had mammography in [facility], which showed some architectural distortion in dense breasts.  She had an ultrasound there as well, but I do not have that report.  [facility], and on 07/06 Dr. Sadie Haber performed left breast ultrasound, which showed an ill defined solid mass at 12 o'clock, 2 cm from the left nipple meaasuring 2.8 cm.  Ultrasound of the left axilla showed a lymph node with thickened cortex suspicious for metastatic deposit.   Both the breast mass and the lymph node were biopsied that day, and the pathology [id number]) showed the mass to be an invasive ductal carcinoma with abundant mucin, low grade, strongly estrogen and progesterone receptor positive, HER-2 negative with a low proliferation fraction at 5%.  The lymph node biopsy was negative.  With this information the patient was referred to Dr. Brantley Stage, and bilateral breast MRIs were obtained on 07/10.  This showed a 3.6 cm lobulated enhancing mass in the central left breast with no additional masses.  The mildly enlarged left axillary lymph node previously biopsied was again seen by MRI.  It measured 2.1 cm.  There were no other abnormal appearing lymph nodes.  There was a small cyst incidentally noted in the left lobe of the liver.  The patient has refused surgery. Her subsequent history is as detailed below.   PAST MEDICAL HISTORY: Past Medical History:  Diagnosis Date   Breast cancer ([place]) 2011   Pt. opted to not have surgery   Cancer [facility])    Personal history of chemotherapy    tamoxifen  history of: hypertension, season allergies, hypercholesterolemia.   PAST SURGICAL HISTORY: Past Surgical History:  Procedure Laterality Date   ABDOMINAL HYSTERECTOMY     BACK SURGERY     CATARACT EXTRACTION, BILATERAL     LUMBAR LAMINECTOMY     under [place]    Right knee arthroscopy remotely under Dr. Noemi Chapel, s/p TAH/BSO with a prior history of fibroids   FAMILY HISTORY Family History  Problem Relation  Age of Onset   Heart attack Father    Breast cancer Maternal Aunt    Breast cancer Maternal Aunt    Ovarian cancer Neg Hx   The patient's father died from a myocardial infarction at age 26.  The patient's mother died with heart problems at age 39.  The patient is one of 12 siblings, 3 sisters and 9 brothers.  There is no history of breast or ovarian cancer in the immediate family, although 2 of the patient's  mother's sisters out of 50 had breast cancer late in life.   GYNECOLOGIC HISTORY: The patient is GX, P0.  She took hormone replacement after her hysterectomy until about 2005.   SOCIAL HISTORY:  (Updated December 2014) She used to live in [place], and worked for [employer].  When she retired in 2001, she moved back to [place].  She is a widow, and lives in [facility], which is a home for senior citizens.  She has an independent apartment, but if she needed nursing home services, it would be available through the same facility.  Her sister, Melinda Hudson,  also lives in [place].  She is a former Engineer, mining.  In case of any problems, Melinda Hudson would like [place] to contact Melinda Hudson, and Melinda Hudson's home number is [phone], and her cell number is [phone].    ADVANCED DIRECTIVES: in place   HEALTH MAINTENANCE: Social History   Tobacco Use   Smoking status: Never   Smokeless tobacco: Never  Substance Use Topics   Alcohol use: No   Drug use: No     Colonoscopy: 2007/[place]  PAP: s/p hysterectomy  Bone density: May 2010/ normal  Lipid panel: "good", Dr. Charlett Blake  Allergies  Allergen Reactions   Sulfa Antibiotics Rash    Current Outpatient Medications  Medication Sig Dispense Refill   fluticasone (FLONASE) 50 MCG/ACT nasal spray Place 2 sprays into the nose daily. allergies     furosemide (LASIX) 40 MG tablet Take 40 mg by mouth daily.     lisinopril-hydrochlorothiazide (PRINZIDE,ZESTORETIC) 20-25 MG per tablet Take 1 tablet by mouth daily.     omeprazole (PRILOSEC) 20 MG capsule      potassium chloride SA (K-DUR,KLOR-CON) 20 MEQ tablet Take 20 mEq by mouth daily.     sucralfate (CARAFATE) 1 G tablet      No current facility-administered medications for this visit.    OBJECTIVE: African-American woman who appears younger than stated age  87:   02/08/21 1425  BP: (!) 156/65  Pulse: 78  Resp: 16  Temp: 97.9 F (36.6 C)   SpO2: 98%    Wt Readings from Last 3 Encounters:  02/08/21 159 lb 11.2 oz (72.4 kg)  08/11/20 160 lb (72.6 kg)  03/31/20 160 lb 3.2 oz (72.7 kg)   Body mass index is 30.18 kg/m.    ECOG FS:1 - Symptomatic but completely ambulatory  Sclerae unicteric, EOMs intact Wearing a mask No cervical or supraclavicular adenopathy Lungs no rales or rhonchi Heart regular rate and rhythm Abd soft, nontender, positive bowel sounds MSK no focal spinal tenderness, no upper extremity lymphedema Neuro: nonfocal, well oriented, appropriate affect Breasts: The right breast is benign.  I do not palpate a mass in the left breast and there is no skin or nipple change of concern.  Both axillae are benign.   LAB RESULTS: Lab Results  Component Value Date   WBC 6.2 02/08/2021   NEUTROABS 4.1 02/08/2021   HGB 11.1 (L) 02/08/2021  HCT 33.2 (L) 02/08/2021   MCV 89.5 02/08/2021   PLT 238 02/08/2021      Chemistry      Component Value Date/Time   NA 139 08/11/2020 1351   NA 141 01/09/2017 1406   K 4.0 08/11/2020 1351   K 4.1 01/09/2017 1406   CL 105 08/11/2020 1351   CL 104 10/21/2012 1113   CO2 27 08/11/2020 1351   CO2 27 01/09/2017 1406   BUN 17 08/11/2020 1351   BUN 16.4 01/09/2017 1406   CREATININE 0.80 08/11/2020 1351   CREATININE 0.9 01/09/2017 1406      Component Value Date/Time   CALCIUM 9.3 08/11/2020 1351   CALCIUM 9.4 01/09/2017 1406   ALKPHOS 70 08/11/2020 1351   ALKPHOS 56 01/09/2017 1406   AST 30 08/11/2020 1351   AST 22 01/09/2017 1406   ALT 26 08/11/2020 1351   ALT 17 01/09/2017 1406   BILITOT 0.4 08/11/2020 1351   BILITOT 0.36 01/09/2017 1406      STUDIES: Mammography at the breast center scheduled for April 09, 2021   ASSESSMENT: 87 y.o.  [facility] woman status post left breast biopsy in July 2011 for clinical T2 N0, Stage IIA invasive ductal carcinoma, grade 1, strongly estrogen and receptor positive, both at 100%, HER2/neu negative, with low MIB-1 of 5%.  A left axillary lymph node biopsied at the time of initial diagnosis was negative.   (1) Declined surgery.   (2) On neoadjuvant letrozole between July 2011 and October 2012. MRI at that time showed some evidence of tumor growth, letrozole was discontinued   (3) started on tamoxifen in early October 2012, switched to anastrozole starting November 2021 due to concerns regarding blood clots   (4) discontinued Anastrozole February 2022 due to GI concerns and declines Exemestane or any other antiestrogen therapy.    PLAN:  Melinda Hudson is now a little more than 11 years out from her original breast biopsy.  She never underwent surgery and therefore will require continued lifelong follow-up.  She took antiestrogens for 10 years, discontinuing them February 2022.  At the most recent mammogram there was no evidence of disease.  She will have mammography October 2022.  She will return to see [place] in 1year.  She knows to call for any other issue that may develop before then  Total encounter time 20 minutes.Melinda Jews C. Kristopher Attwood, MD 02/08/21 3:00 PM Medical Oncology and Hematology [facility] [place], [place] [address] Tel. [phone]    Fax. [phone]   I, Wilburn Mylar, am acting as scribe for Dr. Virgie Dad. Melinda Hudson.  I, Lurline Del MD, have reviewed the above documentation for accuracy and completeness, and I agree with the above.    *Total Encounter Time as defined by the Centers for Medicare and Medicaid Services includes, in addition to the face-to-face time of a patient visit (documented in the note above) non-face-to-face time: obtaining and reviewing outside history, ordering and reviewing medications, tests or procedures, care coordination (communications with other health care professionals or caregivers) and documentation in the medical record.

## 2021-02-09 ENCOUNTER — Telehealth: Payer: Self-pay | Admitting: Oncology

## 2021-02-09 NOTE — Telephone Encounter (Signed)
Scheduled appt per 8/17 los - mailed letter with appt date and time 

## 2021-04-13 ENCOUNTER — Ambulatory Visit
Admission: RE | Admit: 2021-04-13 | Discharge: 2021-04-13 | Disposition: A | Discharge status: Home / Self Care | Payer: Medicare Other | Source: Ambulatory Visit | Attending: Oncology | Admitting: Oncology

## 2021-04-13 ENCOUNTER — Other Ambulatory Visit: Payer: Self-pay

## 2021-04-13 DIAGNOSIS — C50912 Malignant neoplasm of unspecified site of left female breast: Secondary | ICD-10-CM

## 2021-05-08 NOTE — Progress Notes (Signed)
x

## 2021-05-09 ENCOUNTER — Ambulatory Visit: Payer: Medicare Other | Admitting: Oncology

## 2021-05-09 ENCOUNTER — Other Ambulatory Visit: Payer: Medicare Other

## 2021-12-06 ENCOUNTER — Telehealth: Payer: Self-pay | Admitting: Hematology and Oncology

## 2021-12-06 NOTE — Telephone Encounter (Signed)
Rescheduled appointment per provider request. Left message.

## 2022-01-22 IMAGING — MG DIGITAL DIAGNOSTIC BILAT W/ TOMO W/ CAD
8 series · 8 of 24 positions shown · non-contrast
Comparison: Previous exam(s).

CLINICAL DATA: History of LEFT breast cancer diagnosed in 1677.
Patient has been on long-term antiestrogen treatment. Patient
started anastrozole last year but had to discontinue medication
secondary to side effects.

EXAM:
DIGITAL DIAGNOSTIC BILATERAL MAMMOGRAM WITH TOMOSYNTHESIS AND CAD
TECHNIQUE: Bilateral digital diagnostic mammography and breast tomosynthesis
was performed. The images were evaluated with computer-aided
detection.

[L CC synth-2D]
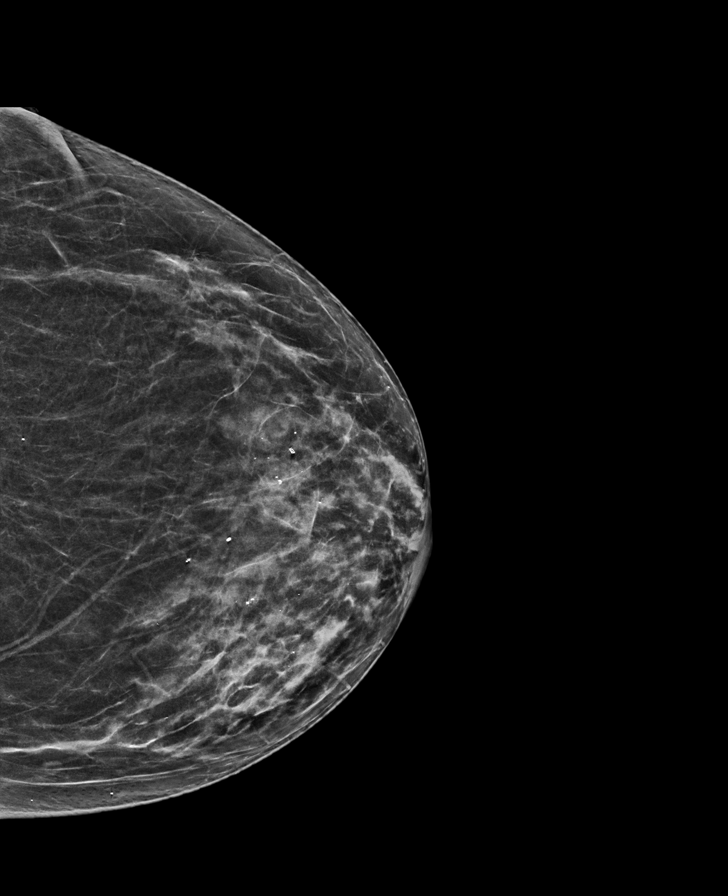

[L MLO synth-2D]
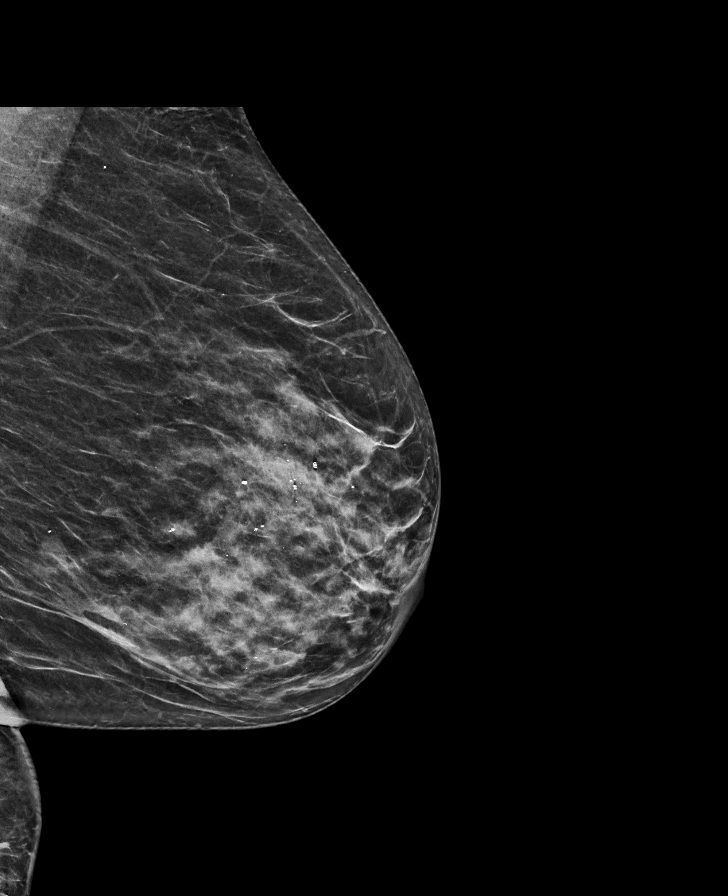

[R MLO synth-2D]
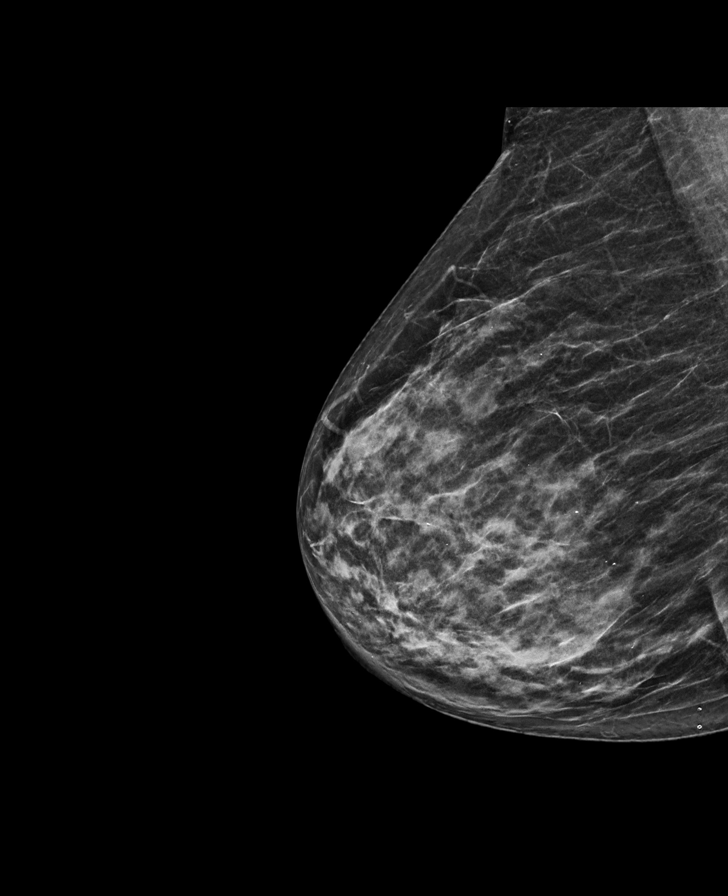

[R CC synth-2D]
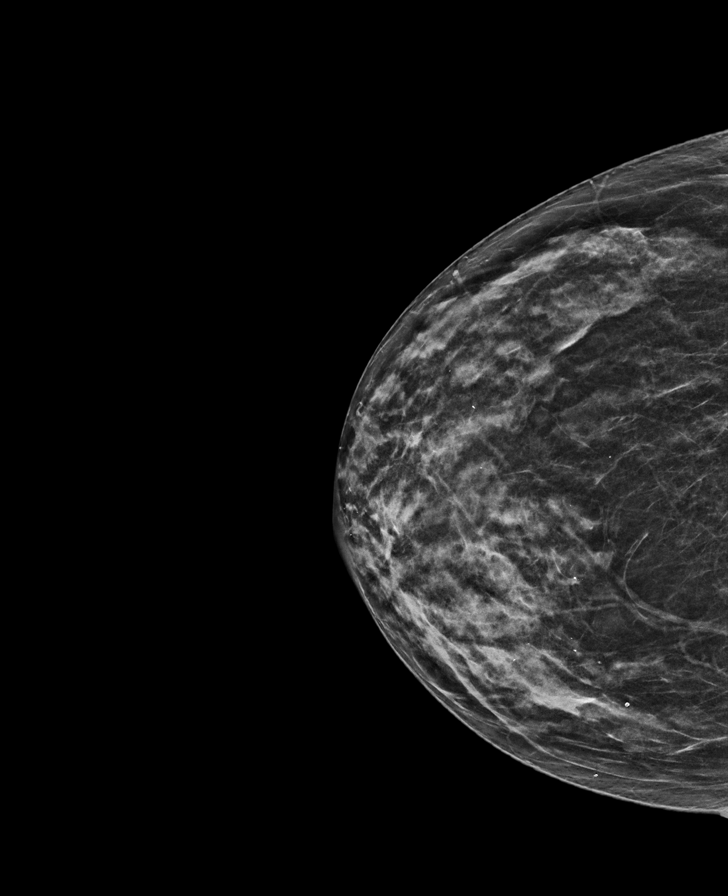

[R CC tomo · tomo slice 28/55.0]
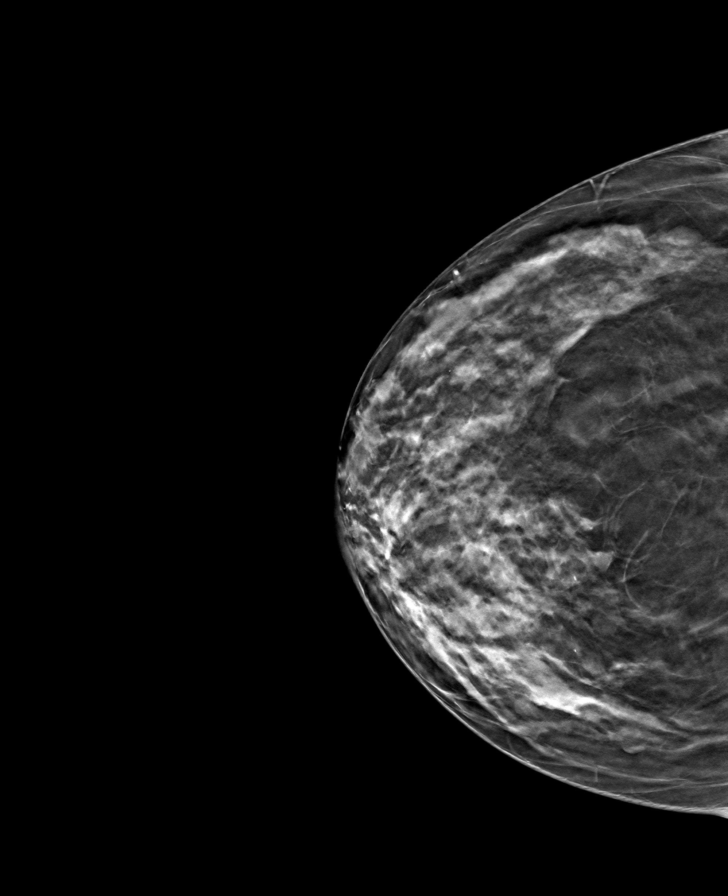

[L MLO tomo · tomo slice 30/59.0]
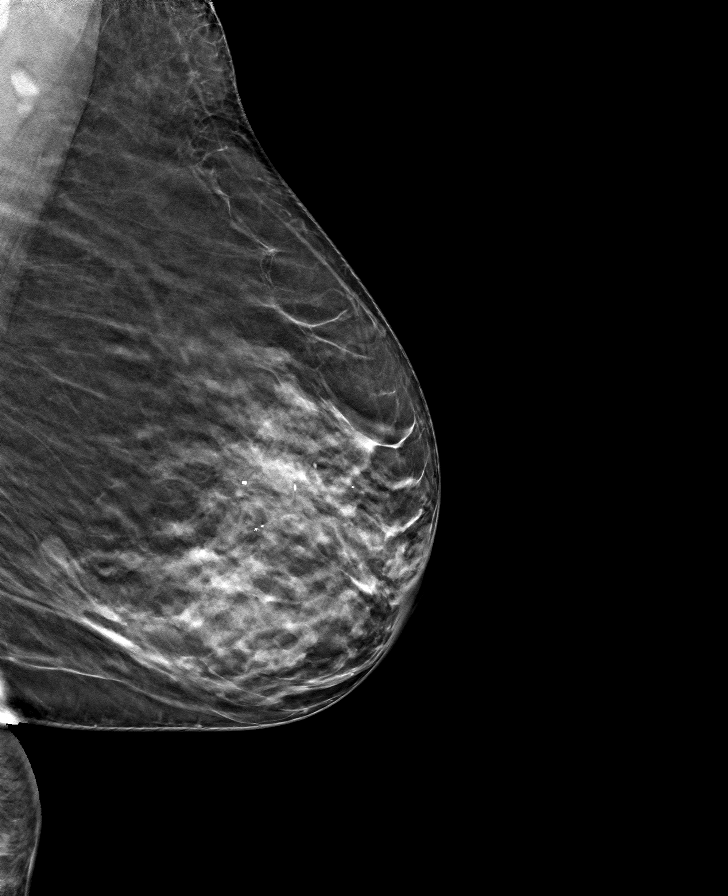

[R MLO tomo · tomo slice 30/59.0]
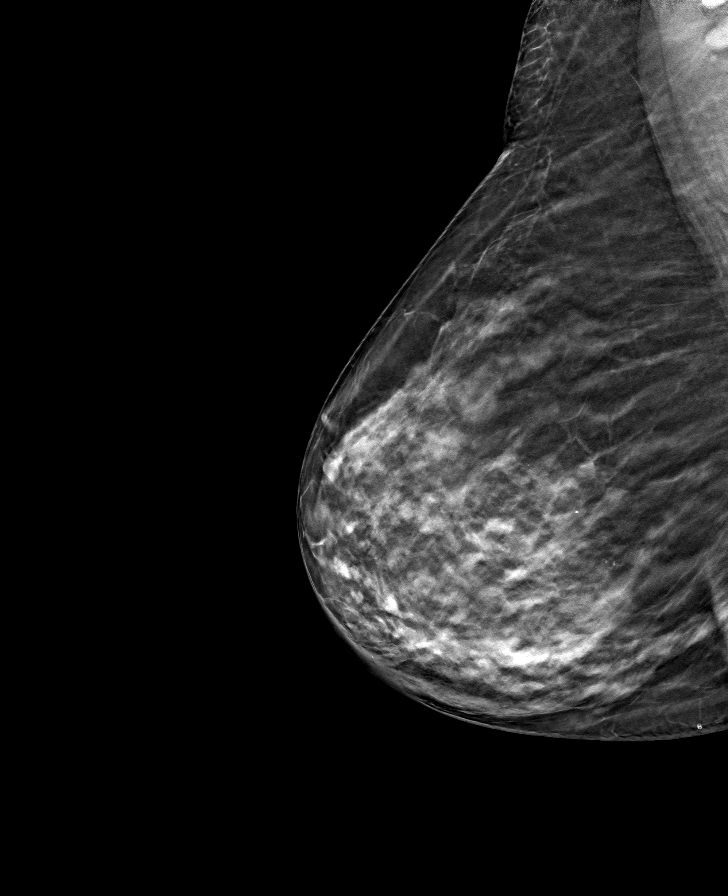

[L CC tomo · tomo slice 30/59.0]
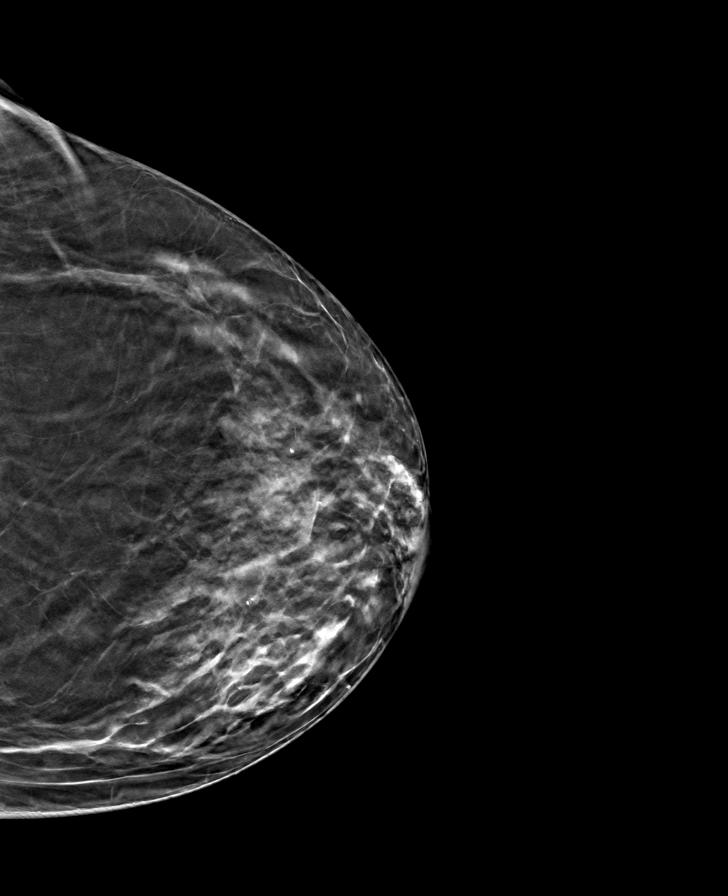

[8 of 24 positions shown; findings below may reference images not displayed]

ACR Breast Density Category c: The breast tissue is heterogeneously
dense, which may obscure small masses.
FINDINGS: Spiral tissue marker clip is identified in the UPPER-OUTER QUADRANT
of the LEFT breast following previous biopsy showing invasive ductal
carcinoma. No new or suspicious findings in either breast.
IMPRESSION: No mammographic evidence for malignancy.

RECOMMENDATION:
Diagnostic mammogram is suggested in 1 year. (Code:CZ-C-TZ6)

I have discussed the findings and recommendations with the patient.
If applicable, a reminder letter will be sent to the patient
regarding the next appointment.

BI-RADS CATEGORY  6: Known biopsy-proven malignancy.

## 2022-02-07 ENCOUNTER — Other Ambulatory Visit: Payer: Self-pay | Admitting: *Deleted

## 2022-02-07 DIAGNOSIS — Z170 Estrogen receptor positive status [ER+]: Secondary | ICD-10-CM

## 2022-02-08 ENCOUNTER — Other Ambulatory Visit: Payer: Self-pay

## 2022-02-08 ENCOUNTER — Inpatient Hospital Stay: Payer: Medicare Other | Admitting: Hematology and Oncology

## 2022-02-08 ENCOUNTER — Encounter: Payer: Self-pay | Admitting: Hematology and Oncology

## 2022-02-08 ENCOUNTER — Inpatient Hospital Stay: Payer: Medicare Other | Attending: Hematology and Oncology

## 2022-02-08 VITALS — BP 130/83 | HR 75 | Temp 97.5°F | Resp 16 | Ht 61.0 in | Wt 148.0 lb

## 2022-02-08 DIAGNOSIS — Z853 Personal history of malignant neoplasm of breast: Secondary | ICD-10-CM | POA: Insufficient documentation

## 2022-02-08 DIAGNOSIS — C50812 Malignant neoplasm of overlapping sites of left female breast: Secondary | ICD-10-CM

## 2022-02-08 DIAGNOSIS — Z90722 Acquired absence of ovaries, bilateral: Secondary | ICD-10-CM | POA: Diagnosis not present

## 2022-02-08 DIAGNOSIS — Z170 Estrogen receptor positive status [ER+]: Secondary | ICD-10-CM

## 2022-02-08 LAB — CMP (CANCER CENTER ONLY)
ALT: 13 U/L (ref 0–44)
AST: 23 U/L (ref 15–41)
Albumin: 4.4 g/dL (ref 3.5–5.0)
Alkaline Phosphatase: 67 U/L (ref 38–126)
Anion gap: 7 (ref 5–15)
BUN: 16 mg/dL (ref 8–23)
CO2: 27 mmol/L (ref 22–32)
Calcium: 9.5 mg/dL (ref 8.9–10.3)
Chloride: 104 mmol/L (ref 98–111)
Creatinine: 0.75 mg/dL (ref 0.44–1.00)
GFR, Estimated: >60 mL/min (ref >60)
Glucose, Bld: 85 mg/dL (ref 70–99)
Potassium: 4.3 mmol/L (ref 3.5–5.1)
Sodium: 138 mmol/L (ref 135–145)
Total Bilirubin: 0.2 mg/dL — ABNORMAL LOW (ref 0.3–1.2)
Total Protein: 7.3 g/dL (ref 6.5–8.1)

## 2022-02-08 LAB — CBC WITH DIFFERENTIAL (CANCER CENTER ONLY)
Abs Immature Granulocytes: 0.02 10*3/uL (ref 0.00–0.07)
Basophils Absolute: 0 10*3/uL (ref 0.0–0.1)
Basophils Relative: 0 %
Eosinophils Absolute: 0.1 10*3/uL (ref 0.0–0.5)
Eosinophils Relative: 1 %
HCT: 36.3 % (ref 36.0–46.0)
Hemoglobin: 12.4 g/dL (ref 12.0–15.0)
Immature Granulocytes: 0 %
Lymphocytes Relative: 24 %
Lymphs Abs: 1.6 10*3/uL (ref 0.7–4.0)
MCH: 30.2 pg (ref 26.0–34.0)
MCHC: 34.2 g/dL (ref 30.0–36.0)
MCV: 88.5 fL (ref 80.0–100.0)
Monocytes Absolute: 0.6 10*3/uL (ref 0.1–1.0)
Monocytes Relative: 8 %
Neutro Abs: 4.4 10*3/uL (ref 1.7–7.7)
Neutrophils Relative %: 67 %
Platelet Count: 256 10*3/uL (ref 150–400)
RBC: 4.1 MIL/uL (ref 3.87–5.11)
RDW: 13.3 % (ref 11.5–15.5)
WBC Count: 6.7 10*3/uL (ref 4.0–10.5)
nRBC: 0 % (ref 0.0–0.2)

## 2022-02-08 NOTE — Progress Notes (Signed)
Melinda Hudson  Telephone:(336) [phone] Fax:(336) [phone]   ID: Melinda Hudson   DOB: 1934-03-10  MR#: [id number]  TMH#:[id number]  Patient Care Team: Melinda Bott, MD as PCP - General (Internal Medicine) OTHER:  Melinda Amsterdam MD   CHIEF COMPLAINT:  Left Breast Cancer  CURRENT TREATMENT: observation   INTERVAL HISTORY: Melinda Hudson returns today for follow up of her left sided ER positive breast cancer.  She never underwent surgery and continues under observation.  Last mammogram in Oct 2022 negative for malignancy. She feels well, She declined surgery, only took anti estrogen therapy for 10 yrs. Since last visit, she had a fall, likely combination of syncope and mechanical fall but she is not entirely sure why this happened. She otherwise feels well.  She is due for mammogram in October 2023.  Rest of Melinda pertinent 10 point ROS reviewed and negative.  COVID 19 VACCINATION STATUS: fully vaccinated Melinda Hudson) with 2 boosters as of August 2022.  Has not had COVID   HISTORY OF PRESENT ILLNESS: From Melinda original intake note:  Melinda patient had mammography in [place], which showed some architectural distortion in dense breasts.  She had an ultrasound there as well, but I do not have that report.  She was referred to [facility] for further evaluation, and on 07/06 Melinda Hudson performed left breast ultrasound, which showed an ill defined solid mass at 12 o'clock, 2 cm from Melinda left nipple meaasuring 2.8 cm.  Ultrasound of Melinda left axilla showed a lymph node with thickened cortex suspicious for metastatic deposit.  Both Melinda breast mass and Melinda lymph node were biopsied that day, and Melinda pathology [id number]) showed Melinda mass to be an invasive ductal carcinoma with abundant mucin, low grade, strongly estrogen and progesterone receptor positive, HER-2 negative with a low proliferation fraction at 5%.  Melinda lymph node biopsy was negative.  With this information Melinda  patient was referred to Melinda Hudson, and bilateral breast MRIs were obtained on 07/10.  This showed a 3.6 cm lobulated enhancing mass in Melinda central left breast with no additional masses.  Melinda mildly enlarged left axillary lymph node previously biopsied was again seen by MRI.  It measured 2.1 cm.  There were no other abnormal appearing lymph nodes.  There was a small cyst incidentally noted in Melinda left lobe of Melinda liver.  Melinda patient has refused surgery. Her subsequent history is as detailed below.   PAST MEDICAL HISTORY: Past Medical History:  Diagnosis Date   Breast cancer ([place]) 2011   Pt. opted to not have surgery   Cancer [place])    Personal history of chemotherapy    tamoxifen  history of: hypertension, season allergies, hypercholesterolemia.   PAST SURGICAL HISTORY: Past Surgical History:  Procedure Laterality Date   ABDOMINAL HYSTERECTOMY     BACK SURGERY     CATARACT EXTRACTION, BILATERAL     LUMBAR LAMINECTOMY     under [facility]    Right knee arthroscopy remotely under Melinda Hudson, s/p TAH/BSO with a prior history of fibroids   FAMILY HISTORY Family History  Problem Relation Age of Onset   Heart attack Father    Breast cancer Maternal Aunt    Breast cancer Maternal Aunt    Ovarian cancer Neg Hx   Melinda patient's father died from a myocardial infarction at age 61.  Melinda patient's mother died with heart problems at age 21.  Melinda patient is one of 12 siblings, 3  sisters and 9 brothers.  There is no history of breast or ovarian cancer in Melinda immediate family, although 2 of Melinda patient's mother's sisters out of 34 had breast cancer late in life.   GYNECOLOGIC HISTORY: Melinda patient is GX, P0.  She took hormone replacement after her hysterectomy until about 2005.   SOCIAL HISTORY:  (Updated December 2014) She used to live in [place], and worked for [place] Hudson.  When she retired in 2001, she moved back to [place].  She is a  widow, and lives in [place], which is a home for senior citizens.  She has an independent apartment, but if she needed nursing home services, it would be available through Melinda same facility.  Her sister, Melinda Hudson,  also lives in [place].  She is a former Engineer, mining.  In case of any problems, Melinda Hudson would like [place] to contact Melinda Hudson, and Melinda Hudson's home number is [phone], and her cell number is [phone].    ADVANCED DIRECTIVES: in place   HEALTH MAINTENANCE: Social History   Tobacco Use   Smoking status: Never   Smokeless tobacco: Never  Substance Use Topics   Alcohol use: No   Drug use: No     Colonoscopy: 2007/[place]  PAP: s/p hysterectomy  Bone density: May 2010/ normal  Lipid panel: "good", Mel[facility]  Allergies  Allergen Reactions   Sulfa Antibiotics Rash    Current Outpatient Medications  Medication Sig Dispense Refill   fluticasone (FLONASE) 50 MCG/ACT nasal spray Place 2 sprays into Melinda nose daily. allergies     furosemide (LASIX) 40 MG tablet Take 40 mg by mouth daily.     lisinopril-hydrochlorothiazide (PRINZIDE,ZESTORETIC) 20-25 MG per tablet Take 1 tablet by mouth daily.     omeprazole (PRILOSEC) 20 MG capsule      potassium chloride SA (K-DUR,KLOR-CON) 20 MEQ tablet Take 20 mEq by mouth daily.     sucralfate (CARAFATE) 1 G tablet      No current facility-administered medications for this visit.    OBJECTIVE: African-American woman who appears younger than stated age  88:   02/08/22 1507  BP: 130/83  Pulse: 75  Resp: 16  Temp: (!) 97.5 F (36.4 C)  SpO2: 98%    Wt Readings from Last 3 Encounters:  02/08/22 148 lb (67.1 kg)  02/08/21 159 lb 11.2 oz (72.4 kg)  08/11/20 160 lb (72.6 kg)   Body mass index is 27.96 kg/m.    ECOG FS:1 - Symptomatic but completely ambulatory  Sclerae unicteric, EOMs intact Breast: Bilateral breasts inspected and palpated.  No palpable masses although there is some  abnormal density in Melinda right breast in Melinda area of previously demonstrated tumor.  No regional adenopathy on Melinda right.  Left breast normal to inspection and palpation.  No regional adenopathy on Melinda left.   LAB RESULTS: Lab Results  Component Value Date   WBC 6.7 02/08/2022   NEUTROABS 4.4 02/08/2022   HGB 12.4 02/08/2022   HCT 36.3 02/08/2022   MCV 88.5 02/08/2022   PLT 256 02/08/2022      Chemistry      Component Value Date/Melinda   NA 140 02/08/2021 1400   NA 141 01/09/2017 1406   K 4.2 02/08/2021 1400   K 4.1 01/09/2017 1406   CL 105 02/08/2021 1400   CL 104 10/21/2012 1113   CO2 27 02/08/2021 1400   CO2 27 01/09/2017 1406   BUN 19 02/08/2021 1400   BUN 16.4  01/09/2017 1406   CREATININE 0.80 02/08/2021 1400   CREATININE 0.9 01/09/2017 1406      Component Value Date/Melinda   CALCIUM 9.2 02/08/2021 1400   CALCIUM 9.4 01/09/2017 1406   ALKPHOS 79 02/08/2021 1400   ALKPHOS 56 01/09/2017 1406   AST 21 02/08/2021 1400   AST 22 01/09/2017 1406   ALT 22 02/08/2021 1400   ALT 17 01/09/2017 1406   BILITOT 0.3 02/08/2021 1400   BILITOT 0.36 01/09/2017 1406      STUDIES: Mammography at [facility] scheduled for April 09, 2021   ASSESSMENT: 88 y.o.  [facility] woman status post left breast biopsy in July 2011 for clinical T2 N0, [place] IIA invasive ductal carcinoma, grade 1, strongly estrogen and receptor positive, both at 100%, HER2/neu negative, with low MIB-1 of 5%. A left axillary lymph node biopsied at Mel[facility] Mel[facility] of initial diagnosis was negative.   (1) Declined surgery.   (2) On neoadjuvant letrozole between July 2011 and October 2012. MRI at that Melinda showed some evidence of tumor growth, letrozole was discontinued   (3) started on tamoxifen in early October 2012, switched to anastrozole starting November 2021 due to concerns regarding blood clots   (4) discontinued Anastrozole February 2022 due to GI concerns and declines Exemestane or any other antiestrogen  therapy.    PLAN:   She is doing quite well at this Melinda.  She is not on any antiestrogen therapy, did not have any surgery but appears to have had complete response to antiestrogen therapy. Breast exam today showed some abnormal density in Melinda right breast however no demonstrated masses.  No regional adenopathy. I have ordered a repeat mammogram to be done in October. She will continue to follow-up with annual mammograms and she will return to clinic in 1 year or sooner as needed. With regards to Melinda falls, she may have some underlying autonomic neuropathy given her age.  Discussed about trying not to change positions suddenly and staying well-hydrated.  Total Melinda spent: 30 minutes.  *Total Encounter Melinda as defined by Melinda Centers for Medicare and Medicaid Services includes, in addition to Melinda face-to-face Melinda of a patient visit (documented in Melinda note above) non-face-to-face Melinda: obtaining and reviewing outside history, ordering and reviewing medications, tests or procedures, care coordination (communications with other health care professionals or caregivers) and documentation in Melinda medical record.

## 2022-02-09 ENCOUNTER — Other Ambulatory Visit: Payer: Medicare Other

## 2022-02-09 ENCOUNTER — Ambulatory Visit: Payer: Medicare Other | Admitting: Hematology and Oncology

## 2022-05-10 ENCOUNTER — Ambulatory Visit: Payer: Medicare Other | Admitting: Hematology and Oncology

## 2022-05-10 ENCOUNTER — Other Ambulatory Visit: Payer: Medicare Other

## 2023-02-12 ENCOUNTER — Inpatient Hospital Stay: Payer: Medicare Other | Attending: Hematology and Oncology | Admitting: Hematology and Oncology

## 2023-02-12 VITALS — BP 152/69 | HR 78 | Temp 97.5°F | Resp 17 | Wt 143.3 lb

## 2023-02-12 DIAGNOSIS — C50812 Malignant neoplasm of overlapping sites of left female breast: Secondary | ICD-10-CM

## 2023-02-12 DIAGNOSIS — Z170 Estrogen receptor positive status [ER+]: Secondary | ICD-10-CM

## 2023-02-12 DIAGNOSIS — Z853 Personal history of malignant neoplasm of breast: Secondary | ICD-10-CM | POA: Insufficient documentation

## 2023-02-12 DIAGNOSIS — Z08 Encounter for follow-up examination after completed treatment for malignant neoplasm: Secondary | ICD-10-CM | POA: Diagnosis present

## 2023-02-12 NOTE — Progress Notes (Signed)
[facility]  Telephone:([phone] [phone]   ID: Melinda Hudson   DOB: 12/12/1933  MR#: [id number]  MVH#:[id number]  Patient Care Team: Lindwood Qua, MD as PCP - General (Internal Medicine) OTHER:  Melinda Flaming MD  CHIEF COMPLAINT:  Left Breast Cancer  CURRENT TREATMENT: observation  INTERVAL HISTORY:  Melinda Hudson returns today for follow up of her left sided ER positive breast cancer.  She never underwent surgery and continues under observation. Last mammogram in Oct 2023 done at OSH, no sig change. Since her last visit, she had a fall, no other complaints.  She is a robust 89, stays active, recently celebrated her birthday with her siblings.  Rest of the pertinent 10 point ROS reviewed and negative   COVID 19 VACCINATION STATUS: fully vaccinated Melinda Hudson) with 2 boosters as of August 2022.  Has not had COVID   HISTORY OF PRESENT ILLNESS: From the original intake note:  The patient had mammography in [facility], which showed some architectural distortion in dense breasts.  She had an ultrasound there as well, but I do not have that report.  She was referred [facility] for [facility] evaluation, and on 07/06 Dr. Deboraha Hudson performed left breast ultrasound, which showed an ill defined solid mass at 12 o'clock, 2 cm from the left nipple meaasuring 2.8 cm.  Ultrasound of the left axilla showed a lymph node with thickened cortex suspicious for metastatic deposit.  Both the breast mass and the lymph node were biopsied that day, and the pathology [id number]) showed the mass to be an invasive ductal carcinoma with abundant mucin, low grade, strongly estrogen and progesterone receptor positive, HER-2 negative with a low proliferation fraction at 5%.  The lymph node biopsy was negative.  With this information the patient was referred to Dr. Luisa Hudson, and bilateral breast MRIs were obtained on 07/10.  This showed a 3.6 cm lobulated enhancing mass in the  central left breast with no additional masses.  The mildly enlarged left axillary lymph node previously biopsied was again seen by MRI.  It measured 2.1 cm.  There were no other abnormal appearing lymph nodes.  There was a small cyst incidentally noted in the left lobe of the liver.  The patient has refused surgery. Her subsequent history is as detailed below.   PAST MEDICAL HISTORY: Past Medical History:  Diagnosis Date   Breast cancer (HCC) 2011   Pt. opted to not have surgery   Cancer [facility])    Personal history of chemotherapy    tamoxifen  history of: hypertension, season allergies, hypercholesterolemia.   PAST SURGICAL HISTORY: Past Surgical History:  Procedure Laterality Date   ABDOMINAL HYSTERECTOMY     BACK SURGERY     CATARACT EXTRACTION, BILATERAL     LUMBAR LAMINECTOMY     under Melinda Hudson   TONSILLECTOMY AND ADENOIDECTOMY    Right knee arthroscopy remotely under Dr. Thurston Hudson, s/p TAH/BSO with a prior history of fibroids   FAMILY HISTORY Family History  Problem Relation Age of Onset   Heart attack Father    Breast cancer Maternal Aunt    Breast cancer Maternal Aunt    Ovarian cancer Neg Hx   The patient's father died from a myocardial infarction at age 74.  The patient's mother died with heart problems at age 4.  The patient is one of 12 siblings, 3 sisters and 9 brothers.  There is no history of breast or ovarian cancer in the immediate family, although 2 of the  patient's mother's sisters out of 4 had breast cancer late in life.   GYNECOLOGIC HISTORY: The patient is GX, P0.  She took hormone replacement after her hysterectomy until about 2005.   SOCIAL HISTORY:  (Updated December 2014) She used to live in [place], and worked for [employer].  When she retired in 2001, she moved back to [place].  She is a widow, and lives in [facility], which is a home for senior citizens.  She has an independent apartment, but if she needed nursing home  services, it would be available through the same facility.  Her sister, Melinda Hudson,  also lives in [place].  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  In case of any problems, Melinda Hudson would like [place] to contact Melinda Hudson, and Melinda Hudson's home number is [phone], and her cell number is [phone].    ADVANCED DIRECTIVES: in place   HEALTH MAINTENANCE: Social History   Tobacco Use   Smoking status: Never   Smokeless tobacco: Never  Substance Use Topics   Alcohol use: No   Drug use: No     Colonoscopy: 2007/Melinda Hudson  PAP: s/p hysterectomy  Bone density: May 2010/ normal  Lipid panel: "good", Dr. Abner Hudson  Allergies  Allergen Reactions   Sulfa Antibiotics Rash    Current Outpatient Medications  Medication Sig Dispense Refill   fluticasone (FLONASE) 50 MCG/ACT nasal spray Place 2 sprays into the nose daily. allergies     furosemide (LASIX) 40 MG tablet Take 40 mg by mouth daily.     lisinopril-hydrochlorothiazide (PRINZIDE,ZESTORETIC) 20-25 MG per tablet Take 1 tablet by mouth daily.     omeprazole (PRILOSEC) 20 MG capsule      potassium chloride SA (K-DUR,KLOR-CON) 20 MEQ tablet Take 20 mEq by mouth daily.     sucralfate (CARAFATE) 1 G tablet      No current facility-administered medications for this visit.    OBJECTIVE: African-American woman who appears younger than stated age  Vitals:   02/12/23 1525  BP: (!) 152/69  Pulse: 78  Resp: 17  Temp: (!) 97.5 F (36.4 C)  SpO2: 99%    Wt Readings from Last 3 Encounters:  02/12/23 143 lb 4.8 oz (65 kg)  02/08/22 148 lb (67.1 kg)  02/08/21 159 lb 11.2 oz (72.4 kg)   Body mass index is 27.08 kg/m.    ECOG FS:1 - Symptomatic but completely ambulatory  Sclerae unicteric, EOMs intact Breast: Bilateral breasts inspected and palpated.  No palpable masses although there is some abnormal density in the right breast in the area of previously demonstrated tumor.  No regional adenopathy on the right.  Left  breast normal to inspection and palpation.  No regional adenopathy on the left.   LAB RESULTS: Lab Results  Component Value Date   WBC 6.7 02/08/2022   NEUTROABS 4.4 02/08/2022   HGB 12.4 02/08/2022   HCT 36.3 02/08/2022   MCV 88.5 02/08/2022   PLT 256 02/08/2022      Chemistry      Component Value Date/Time   NA 138 02/08/2022 1443   NA 141 01/09/2017 1406   K 4.3 02/08/2022 1443   K 4.1 01/09/2017 1406   CL 104 02/08/2022 1443   CL 104 10/21/2012 1113   CO2 27 02/08/2022 1443   CO2 27 01/09/2017 1406   BUN 16 02/08/2022 1443   BUN 16.4 01/09/2017 1406   CREATININE 0.75 02/08/2022 1443   CREATININE 0.9 01/09/2017 1406  Component Value Date/Time   CALCIUM 9.5 02/08/2022 1443   CALCIUM 9.4 01/09/2017 1406   ALKPHOS 67 02/08/2022 1443   ALKPHOS 56 01/09/2017 1406   AST 23 02/08/2022 1443   AST 22 01/09/2017 1406   ALT 13 02/08/2022 1443   ALT 17 01/09/2017 1406   BILITOT 0.2 (L) 02/08/2022 1443   BILITOT 0.36 01/09/2017 1406      STUDIES: Mammography at the breast center scheduled for April 09, 2021   ASSESSMENT: 89 y.o.  [facility] woman status post left breast biopsy in July 2011 for clinical T2 N0, Stage IIA invasive ductal carcinoma, grade 1, strongly estrogen and receptor positive, both at 100%, HER2/neu negative, with low MIB-1 of 5%. A left axillary lymph node biopsied at the time of initial diagnosis was negative.   (1) Declined surgery.   (2) On neoadjuvant letrozole between July 2011 and October 2012. MRI at that time showed some evidence of tumor growth, letrozole was discontinued   (3) started on tamoxifen in early October 2012, switched to anastrozole starting November 2021 due to concerns regarding blood clots   (4) discontinued Anastrozole February 2022 due to GI concerns and declines Exemestane or any other antiestrogen therapy.    PLAN:   She is doing quite well at this time.  She is not on any antiestrogen therapy, did not have any  surgery but appears to have had complete response to antiestrogen therapy. She will continue to follow-up with annual mammograms and she will return to clinic in 1 year or sooner as needed.  Her most recent mammogram done at an outside hospital did not show any significant concerns, overall read as stable.  There is no palpable masses except for an abnormal density in the right breast which continues to remain stable.  I have recommended that she continue annual mammogram and return to clinic for follow-up in 1 year.   Total time spent: 30 minutes.  *Total Encounter Time as defined by the Centers for Medicare and Medicaid Services includes, in addition to the face-to-face time of a patient visit (documented in the note above) non-face-to-face time: obtaining and reviewing outside history, ordering and reviewing medications, tests or procedures, care coordination (communications with other health care professionals or caregivers) and documentation in the medical record.

## 2024-02-13 ENCOUNTER — Inpatient Hospital Stay: Payer: Medicare Other | Attending: Hematology and Oncology | Admitting: Hematology and Oncology
